# Patient Record
Sex: Female | Born: 1980 | Race: Black or African American | Hispanic: No | Marital: Married | Smoking: Never smoker
Health system: Southern US, Community
[De-identification: ages and names within clinical notes are randomized; demographics above are authoritative.]

## PROBLEM LIST (undated history)

## (undated) DIAGNOSIS — I10 Essential (primary) hypertension: Secondary | ICD-10-CM

## (undated) DIAGNOSIS — D62 Acute posthemorrhagic anemia: Secondary | ICD-10-CM

---

## 2005-12-28 ENCOUNTER — Emergency Department (HOSPITAL_COMMUNITY): Admission: EM | Admit: 2005-12-28 | Discharge: 2005-12-28 | Payer: Self-pay | Admitting: Family Medicine

## 2006-04-18 ENCOUNTER — Emergency Department (HOSPITAL_COMMUNITY): Admission: EM | Admit: 2006-04-18 | Discharge: 2006-04-18 | Payer: Self-pay | Admitting: Family Medicine

## 2006-05-26 ENCOUNTER — Emergency Department (HOSPITAL_COMMUNITY): Admission: AD | Admit: 2006-05-26 | Discharge: 2006-05-26 | Payer: Self-pay | Admitting: Family Medicine

## 2007-01-07 ENCOUNTER — Emergency Department (HOSPITAL_COMMUNITY): Admission: EM | Admit: 2007-01-07 | Discharge: 2007-01-07 | Payer: Self-pay | Admitting: Emergency Medicine

## 2008-10-13 ENCOUNTER — Emergency Department (HOSPITAL_COMMUNITY): Admission: EM | Admit: 2008-10-13 | Discharge: 2008-10-13 | Payer: Self-pay | Admitting: Family Medicine

## 2009-09-26 ENCOUNTER — Emergency Department (HOSPITAL_COMMUNITY): Admission: EM | Admit: 2009-09-26 | Discharge: 2009-09-26 | Payer: Self-pay | Admitting: Emergency Medicine

## 2011-08-17 LAB — POCT RAPID STREP A: Streptococcus, Group A Screen (Direct): NEGATIVE

## 2012-09-30 ENCOUNTER — Encounter (HOSPITAL_COMMUNITY): Payer: Self-pay | Admitting: Emergency Medicine

## 2012-09-30 ENCOUNTER — Emergency Department (HOSPITAL_COMMUNITY)
Admission: EM | Admit: 2012-09-30 | Discharge: 2012-09-30 | Disposition: A | Discharge status: Home / Self Care | Payer: Self-pay | Source: Home / Self Care | Attending: Emergency Medicine | Admitting: Emergency Medicine

## 2012-09-30 DIAGNOSIS — H109 Unspecified conjunctivitis: Secondary | ICD-10-CM

## 2012-09-30 DIAGNOSIS — J020 Streptococcal pharyngitis: Secondary | ICD-10-CM

## 2012-09-30 MED ORDER — ACETAMINOPHEN 325 MG PO TABS
ORAL_TABLET | ORAL | Status: AC
  Filled 2012-09-30: qty 2

## 2012-09-30 MED ORDER — PENICILLIN V POTASSIUM 500 MG PO TABS: 500.0000 mg | ORAL_TABLET | Freq: Four times a day (QID) | ORAL | Status: DC

## 2012-09-30 MED ORDER — ACETAMINOPHEN 325 MG PO TABS
650.0000 mg | ORAL_TABLET | Freq: Once | ORAL | Status: AC
  Administered 2012-09-30: 650 mg via ORAL

## 2012-09-30 MED ORDER — POLYMYXIN B-TRIMETHOPRIM 10000-0.1 UNIT/ML-% OP SOLN: 1.0000 [drp] | OPHTHALMIC | Status: DC

## 2012-09-30 NOTE — ED Notes (Signed)
Provided ginger ale/ice.   Provided options to family

## 2012-09-30 NOTE — ED Notes (Signed)
Clarification , swabbed throat prior to administering tylenol and offering ginger ale/ic/water.

## 2012-09-30 NOTE — ED Provider Notes (Signed)
Chief Complaint  Patient presents with  . URI    History of Present Illness:   Morgan Colon is a 31 year old female who presents with a six-day history that began with bilateral red and sore eyes. She had clear discharge and some crusting. Thereafter she noted nasal congestion with bloody, yellow drainage, sneezing, and left ear congestion. Her throat was very sore and she had pain on swallowing. Her neck felt sore, she has fever, chills, and nausea. She denies any cough.  Review of Systems:  Other than noted above, the patient denies any of the following symptoms. Systemic:  No fever, chills, sweats, fatigue, myalgias, headache, or anorexia. Eye:  No redness, pain or drainage. ENT:  No earache, ear congestion, nasal congestion, sneezing, rhinorrhea, sinus pressure, sinus pain, post nasal drip, or sore throat. Lungs:  No cough, sputum production, wheezing, shortness of breath, or chest pain. GI:  No abdominal pain, nausea, vomiting, or diarrhea.  PMFSH:  Past medical history, family history, social history, meds, and allergies were reviewed.  Physical Exam:   Vital signs:  BP 122/86  Pulse 120  Temp 100.3 F (37.9 C) (Oral)  Resp 22  SpO2 98%  LMP 09/11/2012 General:  Alert, in no distress. Eye:  Conjunctiva is are injected and there is no drainage or crusting, corneas are intact, anterior chambers are normal, PERRLA, full EOMs. Lids were normal. ENT:  There was fluid behind the left TM but no erythema or inflammation. The right TM was normal.  Nasal mucosa was clear and uncongested, without drainage.  Mucous membranes were moist.  Tonsils were enlarged and red with copious white exudate.  There were no oral ulcerations or lesions. Neck:  Supple, no adenopathy, tenderness or mass. Lungs:  No respiratory distress.  Lungs were clear to auscultation, without wheezes, rales or rhonchi.  Breath sounds were clear and equal bilaterally.  Heart:  Regular rhythm, without gallops, murmers or  rubs. Skin:  Clear, warm, and dry, without rash or lesions.  Labs:   Results for orders placed during the hospital encounter of 09/30/12  POCT RAPID STREP A ([facility] ONLY)      Component Value Range   Streptococcus, Group A Screen (Direct) POSITIVE (*) NEGATIVE   Assessment:  The primary encounter diagnosis was Strep throat. A diagnosis of Conjunctivitis was also pertinent to this visit.  Plan:   1.  The following meds were prescribed:   New Prescriptions   PENICILLIN V POTASSIUM (VEETID) 500 MG TABLET    Take 1 tablet (500 mg total) by mouth 4 (four) times daily.   TRIMETHOPRIM-POLYMYXIN B (POLYTRIM) OPHTHALMIC SOLUTION    Place 1 drop into both eyes every 4 (four) hours.   2.  The patient was instructed in symptomatic care and handouts were given. 3.  The patient was told to return if becoming worse in any way, if no better in 3 or 4 days, and given some red flag symptoms that would indicate earlier return.   Reuben Likes, MD 09/30/12 [phone]

## 2012-09-30 NOTE — ED Notes (Signed)
Notified dr Lorenz Coaster of patient complaint, elevated heart rate, elevated temp.

## 2012-09-30 NOTE — ED Notes (Signed)
Onset of symptoms reported as Monday with left eye being red, since then has progressed with nasal stuffiness, sore throat, left ear fullness.  Did not check temp, but reports chills.  This am reports nausea, no vomiting, no diarrhea

## 2012-10-01 ENCOUNTER — Telehealth (HOSPITAL_COMMUNITY): Payer: Self-pay | Admitting: Emergency Medicine

## 2012-10-01 NOTE — ED Notes (Signed)
Patient called requesting pain medication.  She also stated she felt worse today then she did yesterday.  Patient stated she has not taken any ibuprofen or tylenol today.  I explained to patient the importance of fever control.  Patient expressed understanding.  Dr Lorenz Coaster reviewed chart and RX for tramadol 50mg  qty 30 2 every 8 hours as needed was called into the [facility] on [address].  Patient made aware and did not have any additional comments or concerns

## 2014-01-15 ENCOUNTER — Emergency Department (HOSPITAL_COMMUNITY)
Admission: EM | Admit: 2014-01-15 | Discharge: 2014-01-15 | Disposition: A | Discharge status: Home / Self Care | Payer: BC Managed Care – PPO | Source: Home / Self Care | Attending: Family Medicine | Admitting: Family Medicine

## 2014-01-15 ENCOUNTER — Encounter (HOSPITAL_COMMUNITY): Payer: Self-pay | Admitting: Emergency Medicine

## 2014-01-15 DIAGNOSIS — J069 Acute upper respiratory infection, unspecified: Secondary | ICD-10-CM

## 2014-01-15 MED ORDER — GUAIFENESIN-CODEINE 100-10 MG/5ML PO SYRP: 10.0000 mL | ORAL_SOLUTION | Freq: Four times a day (QID) | ORAL | Status: DC | PRN

## 2014-01-15 MED ORDER — IPRATROPIUM BROMIDE 0.06 % NA SOLN: 2.0000 | Freq: Four times a day (QID) | NASAL | Status: DC

## 2014-01-15 NOTE — ED Notes (Signed)
Patient complains of head and chest congestion; sinus drainage and sore throat and cough; denies fever/chills, n/v, diarrhea.

## 2014-01-15 NOTE — Discharge Instructions (Signed)
Drink plenty of fluids as discussed, use medicine as prescribed, and mucinex or delsym for cough. Return or see your doctor if further problems and have blood pressure evaluation.

## 2014-01-15 NOTE — ED Provider Notes (Signed)
CSN: [id number]     Arrival date & time 01/15/14  1200 History   First MD Initiated Contact with Patient 01/15/14 1308     Chief Complaint  Patient presents with  . URI   (Consider location/radiation/quality/duration/timing/severity/associated sxs/prior Treatment) Patient is a 33 y.o. female presenting with URI. The history is provided by the patient.  URI Presenting symptoms: congestion, cough, rhinorrhea and sore throat   Presenting symptoms: no fever   Severity:  Mild Onset quality:  Gradual Duration:  2 days Progression:  Unchanged Chronicity:  New Ineffective treatments:  OTC medications Risk factors: sick contacts     History reviewed. No pertinent past medical history. History reviewed. No pertinent past surgical history. No family history on file. History  Substance Use Topics  . Smoking status: Never Smoker   . Smokeless tobacco: Not on file  . Alcohol Use: No   OB History   Grav Para Term Preterm Abortions TAB SAB Ect Mult Living                 Review of Systems  Constitutional: Negative.  Negative for fever.  HENT: Positive for congestion, postnasal drip, rhinorrhea and sore throat.   Respiratory: Positive for cough.   Cardiovascular: Negative.   Gastrointestinal: Negative.     Allergies  Review of patient's allergies indicates no known allergies.  Home Medications   Current Outpatient Rx  Name  Route  Sig  Dispense  Refill  . Phenyleph-Doxylamine-DM-APAP (ALKA-SELTZER PLS NIGHT CLD/FLU PO)   Oral   Take by mouth.         Marland Kitchen. guaiFENesin-codeine (ROBITUSSIN AC) 100-10 MG/5ML syrup   Oral   Take 10 mLs by mouth 4 (four) times daily as needed for cough.   180 mL   0   . ipratropium (ATROVENT) 0.06 % nasal spray   Each Nare   Place 2 sprays into both nostrils 4 (four) times daily.   15 mL   1   . penicillin v potassium (VEETID) 500 MG tablet   Oral   Take 1 tablet (500 mg total) by mouth 4 (four) times daily.   40 tablet   0   .  trimethoprim-polymyxin b (POLYTRIM) ophthalmic solution   Both Eyes   Place 1 drop into both eyes every 4 (four) hours.   10 mL   0    BP 156/111  Pulse 101  Temp(Src) 98 F (36.7 C) (Oral)  Resp 16  SpO2 96%  LMP 01/15/2014 Physical Exam  Nursing note and vitals reviewed. Constitutional: She is oriented to person, place, and time. She appears well-developed and well-nourished. No distress.  HENT:  Right Ear: External ear normal.  Left Ear: External ear normal.  Nose: Mucosal edema and rhinorrhea present.  Mouth/Throat: Oropharynx is clear and moist. No oropharyngeal exudate.  Eyes: Pupils are equal, round, and reactive to light.  Neck: Normal range of motion. Neck supple.  Cardiovascular: Regular rhythm and normal heart sounds.   Pulmonary/Chest: Breath sounds normal.  Lymphadenopathy:    She has no cervical adenopathy.  Neurological: She is alert and oriented to person, place, and time.  Skin: Skin is warm and dry.    ED Course  Procedures (including critical care time) Labs Review Labs Reviewed - No data to display Imaging Review No results found.   MDM   1. URI (upper respiratory infection)        Linna HoffJames D Jalee Saine, MD 01/15/14 1340

## 2018-01-10 ENCOUNTER — Encounter (HOSPITAL_COMMUNITY): Payer: Self-pay | Admitting: *Deleted

## 2018-01-10 ENCOUNTER — Other Ambulatory Visit: Payer: Self-pay | Admitting: Obstetrics and Gynecology

## 2018-01-10 ENCOUNTER — Other Ambulatory Visit: Payer: Self-pay

## 2018-01-10 ENCOUNTER — Inpatient Hospital Stay (HOSPITAL_COMMUNITY)
Admission: AD | Admit: 2018-01-10 | Discharge: 2018-01-10 | Disposition: A | Discharge status: Home / Self Care | Payer: BC Managed Care – PPO | Source: Ambulatory Visit | Attending: Obstetrics and Gynecology | Admitting: Obstetrics and Gynecology

## 2018-01-10 ENCOUNTER — Inpatient Hospital Stay (HOSPITAL_COMMUNITY): Payer: BC Managed Care – PPO

## 2018-01-10 DIAGNOSIS — E669 Obesity, unspecified: Secondary | ICD-10-CM | POA: Insufficient documentation

## 2018-01-10 DIAGNOSIS — O99213 Obesity complicating pregnancy, third trimester: Secondary | ICD-10-CM | POA: Diagnosis not present

## 2018-01-10 DIAGNOSIS — O321XX0 Maternal care for breech presentation, not applicable or unspecified: Secondary | ICD-10-CM | POA: Diagnosis not present

## 2018-01-10 DIAGNOSIS — I10 Essential (primary) hypertension: Secondary | ICD-10-CM

## 2018-01-10 DIAGNOSIS — O133 Gestational [pregnancy-induced] hypertension without significant proteinuria, third trimester: Secondary | ICD-10-CM | POA: Diagnosis present

## 2018-01-10 DIAGNOSIS — Z3A34 34 weeks gestation of pregnancy: Secondary | ICD-10-CM | POA: Insufficient documentation

## 2018-01-10 DIAGNOSIS — O10013 Pre-existing essential hypertension complicating pregnancy, third trimester: Secondary | ICD-10-CM

## 2018-01-10 LAB — COMPREHENSIVE METABOLIC PANEL
ALBUMIN: 2.4 g/dL — AB (ref 3.5–5.0)
ALK PHOS: 151 U/L — AB (ref 38–126)
ALT: 15 U/L (ref 14–54)
ANION GAP: 7 (ref 5–15)
AST: 19 U/L (ref 15–41)
BILIRUBIN TOTAL: 0.7 mg/dL (ref 0.3–1.2)
BUN: 6 mg/dL (ref 6–20)
CO2: 22 mmol/L (ref 22–32)
Calcium: 8.7 mg/dL — ABNORMAL LOW (ref 8.9–10.3)
Chloride: 103 mmol/L (ref 101–111)
Creatinine, Ser: 0.49 mg/dL (ref 0.44–1.00)
GFR calc Af Amer: >60 mL/min (ref >60)
GFR calc non Af Amer: >60 mL/min (ref >60)
GLUCOSE: 91 mg/dL (ref 65–99)
Potassium: 3.9 mmol/L (ref 3.5–5.1)
SODIUM: 132 mmol/L — AB (ref 135–145)
TOTAL PROTEIN: 6 g/dL — AB (ref 6.5–8.1)

## 2018-01-10 LAB — PROTEIN / CREATININE RATIO, URINE
Creatinine, Urine: 16 mg/dL
Total Protein, Urine: <6 mg/dL

## 2018-01-10 LAB — CBC
HCT: 28.1 % — ABNORMAL LOW (ref 36.0–46.0)
HEMOGLOBIN: 9 g/dL — AB (ref 12.0–15.0)
MCH: 25.4 pg — ABNORMAL LOW (ref 26.0–34.0)
MCHC: 32 g/dL (ref 30.0–36.0)
MCV: 79.4 fL (ref 78.0–100.0)
Platelets: 282 10*3/uL (ref 150–400)
RBC: 3.54 MIL/uL — AB (ref 3.87–5.11)
RDW: 14.1 % (ref 11.5–15.5)
WBC: 9.9 10*3/uL (ref 4.0–10.5)

## 2018-01-10 LAB — URIC ACID: URIC ACID, SERUM: 3.5 mg/dL (ref 2.3–6.6)

## 2018-01-10 MED ORDER — BETAMETHASONE SOD PHOS & ACET 6 (3-3) MG/ML IJ SUSP
12.0000 mg | Freq: Once | INTRAMUSCULAR | Status: AC
  Administered 2018-01-10: 12 mg via INTRAMUSCULAR
  Filled 2018-01-10: qty 2

## 2018-01-10 MED ORDER — LABETALOL HCL 100 MG PO TABS: 100.0000 mg | ORAL_TABLET | Freq: Two times a day (BID) | ORAL | 4 refills | Status: DC

## 2018-01-10 MED ORDER — LABETALOL HCL 100 MG PO TABS
100.0000 mg | ORAL_TABLET | Freq: Two times a day (BID) | ORAL | Status: DC
  Administered 2018-01-10: 100 mg via ORAL
  Filled 2018-01-10: qty 1

## 2018-01-10 NOTE — MAU Note (Signed)
BP fluctuates, has been pretty good, last 2 visits has been going up. Denies HA, visual changes, epigastric pain or increased swelling.  Dent over for further eval

## 2018-01-10 NOTE — MAU Provider Note (Signed)
History     Chief Complaint  Patient presents with  . Hypertension  37 yo G2P1001 BF @ [redacted] weeks gestation with chronic HTN . Pt was sent from office due to elevated BP( 150/100, 146/101 r/o  Superimposed preeclampsia Pt has been on bed rest since 24 hr UTP 591 mg. Denies visual changes or epigastric pain or h/a. No leg swelling  OB History    Gravida Para Term Preterm AB Living   2 1 1     1    SAB TAB Ectopic Multiple Live Births                  History reviewed. No pertinent past medical history.  History reviewed. No pertinent surgical history.  Family History  Problem Relation Age of Onset  . Hypertension Mother   . Diabetes Mother     Social History   Tobacco Use  . Smoking status: Never Smoker  . Smokeless tobacco: Never Used  Substance Use Topics  . Alcohol use: No  . Drug use: No    Allergies: No Known Allergies  Medications Prior to Admission  Medication Sig Dispense Refill Last Dose  . guaiFENesin-codeine (ROBITUSSIN AC) 100-10 MG/5ML syrup Take 10 mLs by mouth 4 (four) times daily as needed for cough. 180 mL 0   . ipratropium (ATROVENT) 0.06 % nasal spray Place 2 sprays into both nostrils 4 (four) times daily. 15 mL 1   . penicillin v potassium (VEETID) 500 MG tablet Take 1 tablet (500 mg total) by mouth 4 (four) times daily. 40 tablet 0   . Phenyleph-Doxylamine-DM-APAP (ALKA-SELTZER PLS NIGHT CLD/FLU PO) Take by mouth.   01/15/2014 at Unknown time  . trimethoprim-polymyxin b (POLYTRIM) ophthalmic solution Place 1 drop into both eyes every 4 (four) hours. 10 mL 0      Physical Exam   Blood pressure (!) 143/98, pulse (!) 101, temperature 98.9 F (37.2 C), temperature source Oral, resp. rate 18, weight 117.8 kg (259 lb 12 oz), SpO2 98 %.  General appearance: alert, cooperative and no distress Heart: regular rate and rhythm, S1, S2 normal, no murmur, click, rub or gallop Abdomen: gravid Pelvic: external genitalia normal and cervix  long/closed/OOP   Tracing: baseline 150 NR small accel   ED Course  ZOX:[id number]:chronic HTN r/o preeclampsia P) PIH labs. BPP MDM Addendum:  Margo Ayesono done: breech, 7lb 1 oz( 90%) nl fluid. BPP 8/8 Labs: protein/creatinine ratio pending AST/ALT 19/19 plt 282K hgb 9 Uric acid 3.5  Imp: chronic HTN w/o superimposed preeclampsia. Breech. BMZ #1 today Labetalol 100 mg po bid  D/c home. PIH warning signs f/u office for 2nd BMZ   Serita KyleSheronette A Ladell Bey, MD 1:15 PM 01/10/2018

## 2018-01-10 NOTE — Discharge Instructions (Signed)
Preeclampsia and Eclampsia °Preeclampsia is a serious condition that develops only during pregnancy. It is also called toxemia of pregnancy. This condition causes high blood pressure along with other symptoms, such as swelling and headaches. These symptoms may develop as the condition gets worse. Preeclampsia may occur at 20 weeks of pregnancy or later. °Diagnosing and treating preeclampsia early is very important. If not treated early, it can cause serious problems for you and your baby. One problem it can lead to is eclampsia, which is a condition that causes muscle jerking or shaking (convulsions or seizures) in the mother. Delivering your baby is the best treatment for preeclampsia or eclampsia. Preeclampsia and eclampsia symptoms usually go away after your baby is born. °What are the causes? °The cause of preeclampsia is not known. °What increases the risk? °The following risk factors make you more likely to develop preeclampsia: °· Being pregnant for the first time. °· Having had preeclampsia during a past pregnancy. °· Having a family history of preeclampsia. °· Having high blood pressure. °· Being pregnant with twins or triplets. °· Being 35 or older. °· Being African-American. °· Having kidney disease or diabetes. °· Having medical conditions such as lupus or blood diseases. °· Being very overweight (obese). ° °What are the signs or symptoms? °The earliest signs of preeclampsia are: °· High blood pressure. °· Increased protein in your urine. Your health care provider will check for this at every visit before you give birth (prenatal visit). ° °Other symptoms that may develop as the condition gets worse include: °· Severe headaches. °· Sudden weight gain. °· Swelling of the hands, face, legs, and feet. °· Nausea and vomiting. °· Vision problems, such as blurred or double vision. °· Numbness in the face, arms, legs, and feet. °· Urinating less than usual. °· Dizziness. °· Slurred speech. °· Abdominal pain,  especially upper abdominal pain. °· Convulsions or seizures. ° °Symptoms generally go away after giving birth. °How is this diagnosed? °There are no screening tests for preeclampsia. Your health care provider will ask you about symptoms and check for signs of preeclampsia during your prenatal visits. You may also have tests that include: °· Urine tests. °· Blood tests. °· Checking your blood pressure. °· Monitoring your baby’s heart rate. °· Ultrasound. ° °How is this treated? °You and your health care provider will determine the treatment approach that is best for you. Treatment may include: °· Having more frequent prenatal exams to check for signs of preeclampsia, if you have an increased risk for preeclampsia. °· Bed rest. °· Reducing how much salt (sodium) you eat. °· Medicine to lower your blood pressure. °· Staying in the hospital, if your condition is severe. There, treatment will focus on controlling your blood pressure and the amount of fluids in your body (fluid retention). °· You may need to take medicine (magnesium sulfate) to prevent seizures. This medicine may be given as an injection or through an IV tube. °· Delivering your baby early, if your condition gets worse. You may have your labor started with medicine (induced), or you may have a cesarean delivery. ° °Follow these instructions at home: °Eating and drinking ° °· Drink enough fluid to keep your urine clear or pale yellow. °· Eat a healthy diet that is low in sodium. Do not add salt to your food. Check nutrition labels to see how much sodium a food or beverage contains. °· Avoid caffeine. °Lifestyle °· Do not use any products that contain nicotine or tobacco, such as cigarettes   and e-cigarettes. If you need help quitting, ask your health care provider. °· Do not use alcohol or drugs. °· Avoid stress as much as possible. Rest and get plenty of sleep. °General instructions °· Take over-the-counter and prescription medicines only as told by your  health care provider. °· When lying down, lie on your side. This keeps pressure off of your baby. °· When sitting or lying down, raise (elevate) your feet. Try putting some pillows underneath your lower legs. °· Exercise regularly. Ask your health care provider what kinds of exercise are best for you. °· Keep all follow-up and prenatal visits as told by your health care provider. This is important. °How is this prevented? °To prevent preeclampsia or eclampsia from developing during another pregnancy: °· Get proper medical care during pregnancy. Your health care provider may be able to prevent preeclampsia or diagnose and treat it early. °· Your health care provider may have you take a low-dose aspirin or a calcium supplement during your next pregnancy. °· You may have tests of your blood pressure and kidney function after giving birth. °· Maintain a healthy weight. Ask your health care provider for help managing weight gain during pregnancy. °· Work with your health care provider to manage any long-term (chronic) health conditions you have, such as diabetes or kidney problems. ° °Contact a health care provider if: °· You gain more weight than expected. °· You have headaches. °· You have nausea or vomiting. °· You have abdominal pain. °· You feel dizzy or light-headed. °Get help right away if: °· You develop sudden or severe swelling anywhere in your body. This usually happens in the legs. °· You gain 5 lbs (2.3 kg) or more during one week. °· You have severe: °? Abdominal pain. °? Headaches. °? Dizziness. °? Vision problems. °? Confusion. °? Nausea or vomiting. °· You have a seizure. °· You have trouble moving any part of your body. °· You develop numbness in any part of your body. °· You have trouble speaking. °· You have any abnormal bleeding. °· You pass out. °This information is not intended to replace advice given to you by your health care provider. Make sure you discuss any questions you have with your health  care provider. °Document Released: 10/29/2000 Document Revised: 06/29/2016 Document Reviewed: 06/07/2016 °Elsevier Interactive Patient Education © 2018 [employer] ° °

## 2018-01-16 ENCOUNTER — Encounter (HOSPITAL_COMMUNITY): Payer: Self-pay | Admitting: *Deleted

## 2018-01-16 ENCOUNTER — Other Ambulatory Visit: Payer: Self-pay

## 2018-01-16 ENCOUNTER — Inpatient Hospital Stay (HOSPITAL_COMMUNITY)
Admission: AD | Admit: 2018-01-16 | Discharge: 2018-01-16 | Disposition: A | Discharge status: Home / Self Care | Payer: BC Managed Care – PPO | Source: Ambulatory Visit | Attending: Obstetrics and Gynecology | Admitting: Obstetrics and Gynecology

## 2018-01-16 DIAGNOSIS — Z79899 Other long term (current) drug therapy: Secondary | ICD-10-CM | POA: Diagnosis not present

## 2018-01-16 DIAGNOSIS — Z8249 Family history of ischemic heart disease and other diseases of the circulatory system: Secondary | ICD-10-CM | POA: Insufficient documentation

## 2018-01-16 DIAGNOSIS — Z833 Family history of diabetes mellitus: Secondary | ICD-10-CM | POA: Diagnosis not present

## 2018-01-16 DIAGNOSIS — O10013 Pre-existing essential hypertension complicating pregnancy, third trimester: Secondary | ICD-10-CM

## 2018-01-16 DIAGNOSIS — Z3A34 34 weeks gestation of pregnancy: Secondary | ICD-10-CM | POA: Insufficient documentation

## 2018-01-16 DIAGNOSIS — O163 Unspecified maternal hypertension, third trimester: Secondary | ICD-10-CM | POA: Diagnosis not present

## 2018-01-16 DIAGNOSIS — R51 Headache: Secondary | ICD-10-CM | POA: Diagnosis present

## 2018-01-16 LAB — CBC
HEMATOCRIT: 28.5 % — AB (ref 36.0–46.0)
HEMOGLOBIN: 9.2 g/dL — AB (ref 12.0–15.0)
MCH: 25.3 pg — ABNORMAL LOW (ref 26.0–34.0)
MCHC: 32.3 g/dL (ref 30.0–36.0)
MCV: 78.3 fL (ref 78.0–100.0)
Platelets: 329 10*3/uL (ref 150–400)
RBC: 3.64 MIL/uL — AB (ref 3.87–5.11)
RDW: 14.3 % (ref 11.5–15.5)
WBC: 11.9 10*3/uL — ABNORMAL HIGH (ref 4.0–10.5)

## 2018-01-16 LAB — PROTEIN / CREATININE RATIO, URINE
Creatinine, Urine: 78 mg/dL
Protein Creatinine Ratio: 0.21 mg/mg{Cre} — ABNORMAL HIGH (ref 0.00–0.15)
Total Protein, Urine: 16 mg/dL

## 2018-01-16 LAB — COMPREHENSIVE METABOLIC PANEL
ALBUMIN: 2.5 g/dL — AB (ref 3.5–5.0)
ALK PHOS: 144 U/L — AB (ref 38–126)
ALT: 15 U/L (ref 14–54)
ANION GAP: 7 (ref 5–15)
AST: 18 U/L (ref 15–41)
BILIRUBIN TOTAL: 0.5 mg/dL (ref 0.3–1.2)
BUN: 8 mg/dL (ref 6–20)
CALCIUM: 8.6 mg/dL — AB (ref 8.9–10.3)
CO2: 24 mmol/L (ref 22–32)
Chloride: 102 mmol/L (ref 101–111)
Creatinine, Ser: 0.66 mg/dL (ref 0.44–1.00)
GFR calc Af Amer: >60 mL/min (ref >60)
GFR calc non Af Amer: >60 mL/min (ref >60)
GLUCOSE: 94 mg/dL (ref 65–99)
Potassium: 4.3 mmol/L (ref 3.5–5.1)
SODIUM: 133 mmol/L — AB (ref 135–145)
TOTAL PROTEIN: 6.4 g/dL — AB (ref 6.5–8.1)

## 2018-01-16 LAB — URIC ACID: Uric Acid, Serum: 3.7 mg/dL (ref 2.3–6.6)

## 2018-01-16 MED ORDER — LABETALOL HCL 200 MG PO TABS: 200.0000 mg | ORAL_TABLET | Freq: Two times a day (BID) | ORAL | 4 refills | Status: DC

## 2018-01-16 MED ORDER — ACETAMINOPHEN 500 MG PO TABS
1000.0000 mg | ORAL_TABLET | Freq: Once | ORAL | Status: AC
  Administered 2018-01-16: 1000 mg via ORAL
  Filled 2018-01-16: qty 2

## 2018-01-16 NOTE — MAU Note (Signed)
BP elevated.  Not really feeling good, head hurts.  Denies visual changes, epigastric pain or increase in swelling.

## 2018-01-16 NOTE — MAU Note (Signed)
Sent from Dr. Cherly Hensenousins office with a headache and elevated BPs.

## 2018-01-16 NOTE — MAU Note (Signed)
Urine is in the Lab 

## 2018-01-16 NOTE — MAU Provider Note (Signed)
History     Chief Complaint  Patient presents with  . Headache  . Hypertension    37 yo G2P1001 BF @ 34 6/[redacted] weeks gestation with chronic HTN sent for further evaluation due to slight h/a. BP at time was 140/90  Denies visual changes or epigastric pain. sono done at office had BPP 8/8 , nl fluid  OB History    Gravida Para Term Preterm AB Living   2 1 1     1    SAB TAB Ectopic Multiple Live Births                  History reviewed. No pertinent past medical history.  History reviewed. No pertinent surgical history.  Family History  Problem Relation Age of Onset  . Hypertension Mother   . Diabetes Mother     Social History   Tobacco Use  . Smoking status: Never Smoker  . Smokeless tobacco: Never Used  Substance Use Topics  . Alcohol use: No  . Drug use: No    Allergies: No Known Allergies  Medications Prior to Admission  Medication Sig Dispense Refill Last Dose  . labetalol (NORMODYNE) 100 MG tablet Take 1 tablet (100 mg total) by mouth 2 (two) times daily. 60 tablet 4   . Prenatal Vit-Fe Fumarate-FA (PRENATAL MULTIVITAMIN) TABS tablet Take 1 tablet by mouth daily at 12 noon.   01/10/2018 at Unknown time     Physical Exam   Blood pressure 134/72, pulse 94, temperature 99 F (37.2 C), temperature source Oral, resp. rate 19, height 5\' 4"  (1.626 m), weight 117.1 kg (258 lb 4 oz), SpO2 97 %.  No exam performed today, done in office.  Tracing baseline 140 (+) accel to 155 No ctx  IMP: chronic HTN on med  headache in pregnancy IUP @ 34 6/7 week P) PIH labs.  ED Course   MDM Addendum: AST/ALT  18/15.  Protein/creatinine ratio 0.21. plt 329K . hgb 9.2. Uric acid 3.7 No evidence of superimposed preeclampsia. D/c home cont with labetalol. PIH warning signs. F/u office 3/8 Serita KyleSheronette A Marylan Glore, MD 9:05 PM 01/16/2018

## 2018-01-26 ENCOUNTER — Inpatient Hospital Stay (HOSPITAL_COMMUNITY)
Admission: AD | Admit: 2018-01-26 | Discharge: 2018-01-26 | Disposition: A | Discharge status: Home / Self Care | Payer: BC Managed Care – PPO | Source: Ambulatory Visit | Attending: Obstetrics and Gynecology | Admitting: Obstetrics and Gynecology

## 2018-01-26 ENCOUNTER — Encounter (HOSPITAL_COMMUNITY): Payer: Self-pay | Admitting: *Deleted

## 2018-01-26 ENCOUNTER — Inpatient Hospital Stay (HOSPITAL_COMMUNITY): Payer: BC Managed Care – PPO

## 2018-01-26 ENCOUNTER — Other Ambulatory Visit: Payer: Self-pay

## 2018-01-26 ENCOUNTER — Other Ambulatory Visit: Payer: Self-pay | Admitting: Obstetrics and Gynecology

## 2018-01-26 DIAGNOSIS — O10919 Unspecified pre-existing hypertension complicating pregnancy, unspecified trimester: Secondary | ICD-10-CM

## 2018-01-26 DIAGNOSIS — O288 Other abnormal findings on antenatal screening of mother: Secondary | ICD-10-CM

## 2018-01-26 DIAGNOSIS — Z3A37 37 weeks gestation of pregnancy: Secondary | ICD-10-CM | POA: Insufficient documentation

## 2018-01-26 DIAGNOSIS — O163 Unspecified maternal hypertension, third trimester: Secondary | ICD-10-CM | POA: Insufficient documentation

## 2018-01-26 DIAGNOSIS — O99213 Obesity complicating pregnancy, third trimester: Secondary | ICD-10-CM

## 2018-01-26 DIAGNOSIS — O403XX0 Polyhydramnios, third trimester, not applicable or unspecified: Secondary | ICD-10-CM | POA: Diagnosis not present

## 2018-01-26 DIAGNOSIS — Z3A36 36 weeks gestation of pregnancy: Secondary | ICD-10-CM

## 2018-01-26 NOTE — MAU Provider Note (Signed)
History     Chief Complaint  Patient presents with  . Non-stress Test   37 yo G2P1001 MBF @ 36 2/[redacted] weeks gestation with chronic HTn on labetalol sent for BPP due to NR NST in office  OB History    Gravida Para Term Preterm AB Living   2 1 1     1    SAB TAB Ectopic Multiple Live Births                  History reviewed. No pertinent past medical history.  History reviewed. No pertinent surgical history.  Family History  Problem Relation Age of Onset  . Hypertension Mother   . Diabetes Mother     Social History   Tobacco Use  . Smoking status: Never Smoker  . Smokeless tobacco: Never Used  Substance Use Topics  . Alcohol use: No  . Drug use: No    Allergies: No Known Allergies  Medications Prior to Admission  Medication Sig Dispense Refill Last Dose  . labetalol (NORMODYNE) 200 MG tablet Take 1 tablet (200 mg total) by mouth 2 (two) times daily. 60 tablet 4   . Prenatal Vit-Fe Fumarate-FA (PRENATAL MULTIVITAMIN) TABS tablet Take 1 tablet by mouth daily at 12 noon.   01/10/2018 at Unknown time     Physical Exam   Blood pressure 139/80, pulse 94, temperature 98.4 F (36.9 C), temperature source Oral, resp. rate 18, height 5\' 4"  (1.626 m), weight 115.7 kg (255 lb).  No exam performed today, done in offfice.  Tracing: baseline 150 (+) accel to 165  IMP: chronic HTn w/o superimposed preeclampsia on labetalol here for further antepartum testing IUP@ 36 2/7 weeks P) BPP ED Course   MDM Addendum Sono: BPP 8/8,  Mild polyhydramnios Breech P) d/c home.  Pt desires ECV. Cont antepartum surveillance. PIH warning signs. Cont labetalol Serita KyleSheronette A Noal Abshier, MD 1:01 PM 01/26/2018

## 2018-01-26 NOTE — MAU Note (Signed)
Pt sent from office for non-reactive NST. Needs BPP and more monitoring. Pt reports good fetal movement.

## 2018-02-04 ENCOUNTER — Encounter (HOSPITAL_COMMUNITY): Payer: Self-pay

## 2018-02-04 ENCOUNTER — Inpatient Hospital Stay (HOSPITAL_COMMUNITY): Payer: BC Managed Care – PPO | Admitting: Anesthesiology

## 2018-02-04 ENCOUNTER — Inpatient Hospital Stay (HOSPITAL_COMMUNITY)
Admission: AD | Admit: 2018-02-04 | Discharge: 2018-02-07 | DRG: 787 | Disposition: A | Discharge status: Home / Self Care | Payer: BC Managed Care – PPO | Source: Ambulatory Visit | Attending: Obstetrics and Gynecology | Admitting: Obstetrics and Gynecology

## 2018-02-04 ENCOUNTER — Encounter (HOSPITAL_COMMUNITY)
Admission: AD | Disposition: A | Discharge status: Home / Self Care | Payer: Self-pay | Source: Ambulatory Visit | Attending: Obstetrics and Gynecology

## 2018-02-04 DIAGNOSIS — O9081 Anemia of the puerperium: Secondary | ICD-10-CM | POA: Diagnosis not present

## 2018-02-04 DIAGNOSIS — O1002 Pre-existing essential hypertension complicating childbirth: Secondary | ICD-10-CM | POA: Diagnosis present

## 2018-02-04 DIAGNOSIS — D62 Acute posthemorrhagic anemia: Secondary | ICD-10-CM | POA: Diagnosis not present

## 2018-02-04 DIAGNOSIS — O99214 Obesity complicating childbirth: Secondary | ICD-10-CM | POA: Diagnosis present

## 2018-02-04 DIAGNOSIS — O139 Gestational [pregnancy-induced] hypertension without significant proteinuria, unspecified trimester: Secondary | ICD-10-CM

## 2018-02-04 DIAGNOSIS — Z3A37 37 weeks gestation of pregnancy: Secondary | ICD-10-CM

## 2018-02-04 DIAGNOSIS — O321XX0 Maternal care for breech presentation, not applicable or unspecified: Secondary | ICD-10-CM | POA: Diagnosis not present

## 2018-02-04 HISTORY — DX: Acute posthemorrhagic anemia: D62

## 2018-02-04 LAB — CBC
HCT: 28.7 % — ABNORMAL LOW (ref 36.0–46.0)
HEMATOCRIT: 28.5 % — AB (ref 36.0–46.0)
HEMOGLOBIN: 9 g/dL — AB (ref 12.0–15.0)
Hemoglobin: 9 g/dL — ABNORMAL LOW (ref 12.0–15.0)
MCH: 24.3 pg — AB (ref 26.0–34.0)
MCH: 24.2 pg — ABNORMAL LOW (ref 26.0–34.0)
MCHC: 31.4 g/dL (ref 30.0–36.0)
MCHC: 31.6 g/dL (ref 30.0–36.0)
MCV: 77.6 fL — AB (ref 78.0–100.0)
MCV: 76.6 fL — AB (ref 78.0–100.0)
PLATELETS: 340 10*3/uL (ref 150–400)
Platelets: 342 10*3/uL (ref 150–400)
RBC: 3.7 MIL/uL — AB (ref 3.87–5.11)
RBC: 3.72 MIL/uL — ABNORMAL LOW (ref 3.87–5.11)
RDW: 15.1 % (ref 11.5–15.5)
RDW: 14.8 % (ref 11.5–15.5)
WBC: 10 10*3/uL (ref 4.0–10.5)
WBC: 12.6 10*3/uL — AB (ref 4.0–10.5)

## 2018-02-04 LAB — TYPE AND SCREEN
ABO/RH(D): O POS
Antibody Screen: NEGATIVE

## 2018-02-04 LAB — ABO/RH: ABO/RH(D): O POS

## 2018-02-04 LAB — POCT FERN TEST: POCT Fern Test: POSITIVE

## 2018-02-04 LAB — RPR: RPR Ser Ql: NONREACTIVE

## 2018-02-04 SURGERY — Surgical Case
Anesthesia: Spinal | Site: Abdomen | Wound class: Clean Contaminated

## 2018-02-04 MED ORDER — LACTATED RINGERS IV BOLUS (SEPSIS)
300.0000 mL | Freq: Once | INTRAVENOUS | Status: AC
  Administered 2018-02-04: 300 mL via INTRAVENOUS

## 2018-02-04 MED ORDER — SIMETHICONE 80 MG PO CHEW
80.0000 mg | CHEWABLE_TABLET | ORAL | Status: DC | PRN
  Filled 2018-02-04: qty 1

## 2018-02-04 MED ORDER — BUPIVACAINE HCL (PF) 0.25 % IJ SOLN
INTRAMUSCULAR | Status: AC
  Filled 2018-02-04: qty 10

## 2018-02-04 MED ORDER — DIBUCAINE 1 % RE OINT
1.0000 "application " | TOPICAL_OINTMENT | RECTAL | Status: DC | PRN
  Filled 2018-02-04: qty 28

## 2018-02-04 MED ORDER — DEXAMETHASONE SODIUM PHOSPHATE 10 MG/ML IJ SOLN
INTRAMUSCULAR | Status: AC
  Filled 2018-02-04: qty 1

## 2018-02-04 MED ORDER — SCOPOLAMINE 1 MG/3DAYS TD PT72
MEDICATED_PATCH | TRANSDERMAL | Status: DC | PRN
  Administered 2018-02-04: 1 via TRANSDERMAL

## 2018-02-04 MED ORDER — PROMETHAZINE HCL 25 MG/ML IJ SOLN: 6.2500 mg | INTRAMUSCULAR | Status: DC | PRN

## 2018-02-04 MED ORDER — DIPHENHYDRAMINE HCL 25 MG PO CAPS
25.0000 mg | ORAL_CAPSULE | Freq: Four times a day (QID) | ORAL | Status: DC | PRN
  Administered 2018-02-04: 25 mg via ORAL
  Filled 2018-02-04 (×2): qty 1

## 2018-02-04 MED ORDER — NALOXONE HCL 0.4 MG/ML IJ SOLN: 0.4000 mg | INTRAMUSCULAR | Status: DC | PRN

## 2018-02-04 MED ORDER — IBUPROFEN 600 MG PO TABS
600.0000 mg | ORAL_TABLET | Freq: Four times a day (QID) | ORAL | Status: DC
  Administered 2018-02-04 – 2018-02-07 (×11): 600 mg via ORAL
  Filled 2018-02-04 (×12): qty 1

## 2018-02-04 MED ORDER — ACETAMINOPHEN 325 MG PO TABS: 650.0000 mg | ORAL_TABLET | ORAL | Status: DC | PRN

## 2018-02-04 MED ORDER — LACTATED RINGERS IV SOLN
INTRAVENOUS | Status: DC
  Administered 2018-02-04 (×3): via INTRAVENOUS

## 2018-02-04 MED ORDER — NALBUPHINE HCL 10 MG/ML IJ SOLN: 5.0000 mg | Freq: Once | INTRAMUSCULAR | Status: AC | PRN

## 2018-02-04 MED ORDER — NALBUPHINE HCL 10 MG/ML IJ SOLN
5.0000 mg | Freq: Once | INTRAMUSCULAR | Status: AC | PRN
  Administered 2018-02-04: 5 mg via INTRAVENOUS

## 2018-02-04 MED ORDER — MORPHINE SULFATE (PF) 0.5 MG/ML IJ SOLN
INTRAMUSCULAR | Status: AC
  Filled 2018-02-04: qty 10

## 2018-02-04 MED ORDER — OXYTOCIN 10 UNIT/ML IJ SOLN
INTRAVENOUS | Status: DC | PRN
  Administered 2018-02-04: 40 [IU] via INTRAVENOUS

## 2018-02-04 MED ORDER — MENTHOL 3 MG MT LOZG
1.0000 | LOZENGE | OROMUCOSAL | Status: DC | PRN
  Filled 2018-02-04: qty 9

## 2018-02-04 MED ORDER — MORPHINE SULFATE (PF) 0.5 MG/ML IJ SOLN
INTRAMUSCULAR | Status: DC | PRN
  Administered 2018-02-04: .2 mg via EPIDURAL
  Administered 2018-02-04: 4.8 mg via INTRAVENOUS

## 2018-02-04 MED ORDER — BUPIVACAINE HCL (PF) 0.25 % IJ SOLN
INTRAMUSCULAR | Status: DC | PRN
  Administered 2018-02-04: 20 mL

## 2018-02-04 MED ORDER — MEPERIDINE HCL 25 MG/ML IJ SOLN: 6.2500 mg | INTRAMUSCULAR | Status: DC | PRN

## 2018-02-04 MED ORDER — SENNOSIDES-DOCUSATE SODIUM 8.6-50 MG PO TABS
2.0000 | ORAL_TABLET | ORAL | Status: DC
  Administered 2018-02-04 – 2018-02-06 (×3): 2 via ORAL
  Filled 2018-02-04 (×5): qty 2

## 2018-02-04 MED ORDER — LACTATED RINGERS IV SOLN
INTRAVENOUS | Status: DC | PRN
  Administered 2018-02-04: 02:00:00 via INTRAVENOUS

## 2018-02-04 MED ORDER — KETOROLAC TROMETHAMINE 30 MG/ML IJ SOLN
INTRAMUSCULAR | Status: AC
  Filled 2018-02-04: qty 1

## 2018-02-04 MED ORDER — HYDROMORPHONE HCL 1 MG/ML IJ SOLN
0.2500 mg | INTRAMUSCULAR | Status: DC | PRN
  Administered 2018-02-04: 0.5 mg via INTRAVENOUS

## 2018-02-04 MED ORDER — DIPHENHYDRAMINE HCL 50 MG/ML IJ SOLN: 12.5000 mg | INTRAMUSCULAR | Status: DC | PRN

## 2018-02-04 MED ORDER — OXYTOCIN 40 UNITS IN LACTATED RINGERS INFUSION - SIMPLE MED
2.5000 [IU]/h | INTRAVENOUS | Status: AC
  Administered 2018-02-04: 2.5 [IU]/h via INTRAVENOUS
  Filled 2018-02-04: qty 1000

## 2018-02-04 MED ORDER — METHYLERGONOVINE MALEATE 0.2 MG/ML IJ SOLN: 0.2000 mg | INTRAMUSCULAR | Status: DC | PRN

## 2018-02-04 MED ORDER — HYDROMORPHONE HCL 1 MG/ML IJ SOLN
INTRAMUSCULAR | Status: AC
  Filled 2018-02-04: qty 0.5

## 2018-02-04 MED ORDER — SODIUM CHLORIDE 0.9% FLUSH: 3.0000 mL | INTRAVENOUS | Status: DC | PRN

## 2018-02-04 MED ORDER — NALBUPHINE HCL 10 MG/ML IJ SOLN: 5.0000 mg | INTRAMUSCULAR | Status: DC | PRN

## 2018-02-04 MED ORDER — METHYLERGONOVINE MALEATE 0.2 MG PO TABS: 0.2000 mg | ORAL_TABLET | ORAL | Status: DC | PRN

## 2018-02-04 MED ORDER — PRENATAL MULTIVITAMIN CH
1.0000 | ORAL_TABLET | Freq: Every day | ORAL | Status: DC
  Administered 2018-02-06 – 2018-02-07 (×2): 1 via ORAL
  Filled 2018-02-04 (×5): qty 1

## 2018-02-04 MED ORDER — ONDANSETRON HCL 4 MG/2ML IJ SOLN
INTRAMUSCULAR | Status: AC
  Filled 2018-02-04: qty 2

## 2018-02-04 MED ORDER — OXYCODONE-ACETAMINOPHEN 5-325 MG PO TABS
1.0000 | ORAL_TABLET | ORAL | Status: DC | PRN
Start: 2018-02-04 — End: 2018-02-07
  Administered 2018-02-05 – 2018-02-06 (×4): 1 via ORAL
  Filled 2018-02-04 (×4): qty 1

## 2018-02-04 MED ORDER — NALOXONE HCL 4 MG/10ML IJ SOLN
1.0000 ug/kg/h | INTRAMUSCULAR | Status: DC | PRN
  Filled 2018-02-04: qty 5

## 2018-02-04 MED ORDER — SCOPOLAMINE 1 MG/3DAYS TD PT72: 1.0000 | MEDICATED_PATCH | Freq: Once | TRANSDERMAL | Status: DC

## 2018-02-04 MED ORDER — NALBUPHINE HCL 10 MG/ML IJ SOLN
5.0000 mg | INTRAMUSCULAR | Status: DC | PRN
  Administered 2018-02-04: 5 mg via INTRAVENOUS
  Filled 2018-02-04 (×2): qty 1

## 2018-02-04 MED ORDER — ZOLPIDEM TARTRATE 5 MG PO TABS: 5.0000 mg | ORAL_TABLET | Freq: Every evening | ORAL | Status: DC | PRN

## 2018-02-04 MED ORDER — TETANUS-DIPHTH-ACELL PERTUSSIS 5-2.5-18.5 LF-MCG/0.5 IM SUSP
0.5000 mL | Freq: Once | INTRAMUSCULAR | Status: DC
  Filled 2018-02-04: qty 0.5

## 2018-02-04 MED ORDER — DEXAMETHASONE SODIUM PHOSPHATE 10 MG/ML IJ SOLN
INTRAMUSCULAR | Status: DC | PRN
  Administered 2018-02-04: 10 mg via INTRAVENOUS

## 2018-02-04 MED ORDER — DIPHENHYDRAMINE HCL 25 MG PO CAPS
25.0000 mg | ORAL_CAPSULE | ORAL | Status: DC | PRN
  Filled 2018-02-04: qty 1

## 2018-02-04 MED ORDER — SOD CITRATE-CITRIC ACID 500-334 MG/5ML PO SOLN
30.0000 mL | Freq: Once | ORAL | Status: AC
  Administered 2018-02-04: 30 mL via ORAL
  Filled 2018-02-04: qty 15

## 2018-02-04 MED ORDER — ONDANSETRON HCL 4 MG/2ML IJ SOLN
INTRAMUSCULAR | Status: DC | PRN
  Administered 2018-02-04: 4 mg via INTRAVENOUS

## 2018-02-04 MED ORDER — SCOPOLAMINE 1 MG/3DAYS TD PT72
MEDICATED_PATCH | TRANSDERMAL | Status: AC
  Filled 2018-02-04: qty 1

## 2018-02-04 MED ORDER — WITCH HAZEL-GLYCERIN EX PADS: 1.0000 "application " | MEDICATED_PAD | CUTANEOUS | Status: DC | PRN

## 2018-02-04 MED ORDER — PHENYLEPHRINE 40 MCG/ML (10ML) SYRINGE FOR IV PUSH (FOR BLOOD PRESSURE SUPPORT)
PREFILLED_SYRINGE | INTRAVENOUS | Status: AC
  Filled 2018-02-04: qty 10

## 2018-02-04 MED ORDER — LABETALOL HCL 200 MG PO TABS
200.0000 mg | ORAL_TABLET | Freq: Two times a day (BID) | ORAL | Status: DC
  Administered 2018-02-04 – 2018-02-06 (×4): 200 mg via ORAL
  Filled 2018-02-04 (×10): qty 1

## 2018-02-04 MED ORDER — COCONUT OIL OIL
1.0000 "application " | TOPICAL_OIL | Status: DC | PRN
  Filled 2018-02-04 (×2): qty 120

## 2018-02-04 MED ORDER — SIMETHICONE 80 MG PO CHEW
80.0000 mg | CHEWABLE_TABLET | ORAL | Status: DC
  Administered 2018-02-04 – 2018-02-06 (×2): 80 mg via ORAL
  Filled 2018-02-04 (×4): qty 1

## 2018-02-04 MED ORDER — PHENYLEPHRINE 8 MG IN D5W 100 ML (0.08MG/ML) PREMIX OPTIME
INJECTION | INTRAVENOUS | Status: DC | PRN
  Administered 2018-02-04: 60 ug/min via INTRAVENOUS

## 2018-02-04 MED ORDER — OXYCODONE-ACETAMINOPHEN 5-325 MG PO TABS: 2.0000 | ORAL_TABLET | ORAL | Status: DC | PRN

## 2018-02-04 MED ORDER — PHENYLEPHRINE HCL 10 MG/ML IJ SOLN
INTRAMUSCULAR | Status: DC | PRN
  Administered 2018-02-04: 80 ug via INTRAVENOUS

## 2018-02-04 MED ORDER — OXYTOCIN 10 UNIT/ML IJ SOLN
INTRAMUSCULAR | Status: AC
  Filled 2018-02-04: qty 4

## 2018-02-04 MED ORDER — BUPIVACAINE IN DEXTROSE 0.75-8.25 % IT SOLN
INTRATHECAL | Status: DC | PRN
  Administered 2018-02-04: 1.6 mL via INTRATHECAL

## 2018-02-04 MED ORDER — KETOROLAC TROMETHAMINE 30 MG/ML IJ SOLN
30.0000 mg | Freq: Once | INTRAMUSCULAR | Status: DC | PRN
  Administered 2018-02-04: 30 mg via INTRAVENOUS

## 2018-02-04 MED ORDER — SIMETHICONE 80 MG PO CHEW
80.0000 mg | CHEWABLE_TABLET | Freq: Three times a day (TID) | ORAL | Status: DC
  Administered 2018-02-04 – 2018-02-06 (×5): 80 mg via ORAL
  Filled 2018-02-04 (×12): qty 1

## 2018-02-04 MED ORDER — LACTATED RINGERS IV SOLN
INTRAVENOUS | Status: DC | PRN
  Administered 2018-02-04: 03:00:00 via INTRAVENOUS

## 2018-02-04 MED ORDER — DEXTROSE 5 % IV SOLN
INTRAVENOUS | Status: DC | PRN
  Administered 2018-02-04: 3 g via INTRAVENOUS

## 2018-02-04 MED ORDER — ONDANSETRON HCL 4 MG/2ML IJ SOLN: 4.0000 mg | Freq: Three times a day (TID) | INTRAMUSCULAR | Status: DC | PRN

## 2018-02-04 SURGICAL SUPPLY — 41 items
ADH SKN CLS LQ APL DERMABOND (GAUZE/BANDAGES/DRESSINGS) ×1
CHLORAPREP W/TINT 26ML (MISCELLANEOUS) ×3 IMPLANT
CLAMP CORD UMBIL (MISCELLANEOUS) IMPLANT
CLOTH BEACON ORANGE TIMEOUT ST (SAFETY) ×3 IMPLANT
DERMABOND ADHESIVE PROPEN (GAUZE/BANDAGES/DRESSINGS) ×2
DERMABOND ADVANCED .7 DNX6 (GAUZE/BANDAGES/DRESSINGS) IMPLANT
DRSG OPSITE POSTOP 4X10 (GAUZE/BANDAGES/DRESSINGS) ×3 IMPLANT
ELECT REM PT RETURN 9FT ADLT (ELECTROSURGICAL) ×3
ELECTRODE REM PT RTRN 9FT ADLT (ELECTROSURGICAL) ×1 IMPLANT
EXTRACTOR VACUUM M CUP 4 TUBE (SUCTIONS) IMPLANT
EXTRACTOR VACUUM M CUP 4' TUBE (SUCTIONS)
GAUZE SPONGE 4X4 12PLY STRL (GAUZE/BANDAGES/DRESSINGS) ×2 IMPLANT
GLOVE BIO SURGEON STRL SZ7.5 (GLOVE) ×3 IMPLANT
GLOVE BIOGEL PI IND STRL 7.0 (GLOVE) ×1 IMPLANT
GLOVE BIOGEL PI INDICATOR 7.0 (GLOVE) ×2
GOWN STRL REUS W/TWL LRG LVL3 (GOWN DISPOSABLE) ×6 IMPLANT
KIT ABG SYR 3ML LUER SLIP (SYRINGE) IMPLANT
NDL HYPO 25X5/8 SAFETYGLIDE (NEEDLE) IMPLANT
NDL SPNL 20GX3.5 QUINCKE YW (NEEDLE) IMPLANT
NEEDLE HYPO 22GX1.5 SAFETY (NEEDLE) ×3 IMPLANT
NEEDLE HYPO 25X5/8 SAFETYGLIDE (NEEDLE) IMPLANT
NEEDLE SPNL 20GX3.5 QUINCKE YW (NEEDLE) IMPLANT
NS IRRIG 1000ML POUR BTL (IV SOLUTION) ×3 IMPLANT
PACK C SECTION WH (CUSTOM PROCEDURE TRAY) ×3 IMPLANT
PAD ABD 8X10 STRL (GAUZE/BANDAGES/DRESSINGS) ×2 IMPLANT
PENCIL SMOKE EVAC W/HOLSTER (ELECTROSURGICAL) ×3 IMPLANT
RETRACTOR TRAXI PANNICULUS (MISCELLANEOUS) IMPLANT
SUT MNCRL 0 VIOLET CTX 36 (SUTURE) ×2 IMPLANT
SUT MNCRL AB 3-0 PS2 27 (SUTURE) ×2 IMPLANT
SUT MON AB 2-0 CT1 27 (SUTURE) ×3 IMPLANT
SUT MON AB-0 CT1 36 (SUTURE) ×8 IMPLANT
SUT MONOCRYL 0 CTX 36 (SUTURE) ×4
SUT PLAIN 0 NONE (SUTURE) IMPLANT
SUT PLAIN 2 0 (SUTURE)
SUT PLAIN 2 0 XLH (SUTURE) ×2 IMPLANT
SUT PLAIN ABS 2-0 CT1 27XMFL (SUTURE) IMPLANT
SYR 20CC LL (SYRINGE) IMPLANT
SYR CONTROL 10ML LL (SYRINGE) ×3 IMPLANT
TOWEL OR 17X24 6PK STRL BLUE (TOWEL DISPOSABLE) ×3 IMPLANT
TRAXI PANNICULUS RETRACTOR (MISCELLANEOUS) ×2
TRAY FOLEY BAG SILVER LF 14FR (SET/KITS/TRAYS/PACK) ×3 IMPLANT

## 2018-02-04 NOTE — H&P (Signed)
Morgan Colon is a 37 y.o. female presenting for SROM and breech. OB History    Gravida  2   Para  1   Term  1   Preterm      AB      Living  1     SAB      TAB      Ectopic      Multiple      Live Births             History reviewed. No pertinent past medical history. No past surgical history on file. Family History: family history includes Diabetes in her mother; Hypertension in her mother. Social History:  reports that she has never smoked. She has never used smokeless tobacco. She reports that she does not drink alcohol or use drugs.     Maternal Diabetes: No Genetic Screening: Normal Maternal Ultrasounds/Referrals: Normal Fetal Ultrasounds or other Referrals:  None Maternal Substance Abuse:  No Significant Maternal Medications:  None Significant Maternal Lab Results:  None Other Comments:  None  Review of Systems  Constitutional: Negative.   All other systems reviewed and are negative.  Maternal Medical History:  Reason for admission: Rupture of membranes.   Contractions: Onset was 1-2 hours ago.   Frequency: irregular.   Perceived severity is mild.    Fetal activity: Perceived fetal activity is normal.   Last perceived fetal movement was within the past hour.    Prenatal complications: PIH.   Prenatal Complications - Diabetes: none.    Dilation: Fingertip Effacement (%): Thick Exam by:: Latricia HeftAnna Cioce, RN Blood pressure 136/81, pulse (!) 101, temperature 98.5 F (36.9 C), resp. rate 19, height 5\' 4"  (1.626 m), weight 120.1 kg (264 lb 12 oz), SpO2 100 %. Maternal Exam:  Uterine Assessment: Contraction strength is mild.  Contraction frequency is irregular.   Abdomen: Patient reports no abdominal tenderness. Fetal presentation: breech  Introitus: Normal vulva. Normal vagina.  Ferning test: positive.  Nitrazine test: positive. Amniotic fluid character: clear.  Pelvis: questionable for delivery.   Cervix: Cervix evaluated by digital exam.      Physical Exam  Nursing note and vitals reviewed. Constitutional: She is oriented to person, place, and time. She appears well-developed and well-nourished.  HENT:  Head: Normocephalic.  Neck: Normal range of motion. Neck supple.  Cardiovascular: Normal rate and regular rhythm.  Respiratory: Effort normal and breath sounds normal.  GI: Soft. Bowel sounds are normal.  Genitourinary: Vagina normal and uterus normal.  Musculoskeletal: Normal range of motion.  Neurological: She is alert and oriented to person, place, and time. She has normal reflexes.  Skin: Skin is warm and dry.  Psychiatric: She has a normal mood and affect.    Prenatal labs: ABO, Rh:   Antibody:   Rubella:   RPR:    HBsAg:    HIV:    GBS:     Assessment/Plan: 37 week IUP SROM  Breech Maternal obesity Gestational HTN Unstable lie at high risk for cord prolapse  Proceed with urgent csection .  Risks vs benefits discussed. Consent done.  Nuel Dejaynes J 02/04/2018, 1:46 AM

## 2018-02-04 NOTE — Op Note (Signed)
Cesarean Section Procedure Note  Indications: malpresentation: frank breech  Pre-operative Diagnosis: 37 week 2 day pregnancy.  Post-operative Diagnosis: same  Surgeon: Lenoard AdenAAVON,Akim Watkinson J   Assistants: Fredric MareBailey, CNM  Anesthesia: Local anesthesia 0.25.% bupivacaine and Spinal anesthesia  ASA Class: 2  Procedure Details  The patient was seen in the Holding Room. The risks, benefits, complications, treatment options, and expected outcomes were discussed with the patient.  The patient concurred with the proposed plan, giving informed consent. The risks of anesthesia, infection, bleeding and possible injury to other organs discussed. Injury to bowel, bladder, or ureter with possible need for repair discussed. Possible need for transfusion with secondary risks of hepatitis or HIV acquisition discussed. Post operative complications to include but not limited to DVT, PE and Pneumonia noted. The site of surgery properly noted/marked. The patient was taken to Operating Room # 9, identified as Baruch MerlLauren R Lafauci and the procedure verified as C-Section Delivery. A Time Out was held and the above information confirmed.  After induction of anesthesia, the patient was draped and prepped in the usual sterile manner. A Pfannenstiel incision was made and carried down through the subcutaneous tissue to the fascia. Fascial incision was made and extended transversely using Mayo scissors. The fascia was separated from the underlying rectus tissue superiorly and inferiorly. The peritoneum was identified and entered. Peritoneal incision was extended longitudinally. The utero-vesical peritoneal reflection was incised transversely and the bladder flap was bluntly freed from the lower uterine segment. A low transverse uterine incision(Kerr hysterotomy) was made. Delivered from frank breech presentation was a  female with Apgar scores of 8 at one minute and 9 at five minutes. Bulb suctioning gently performed. Neonatal team in  attendance.After the umbilical cord was clamped and cut cord blood was obtained for evaluation. The placenta was removed intact and appeared normal. The uterus was curetted with a dry lap pack. Good hemostasis was noted.The uterine outline, tubes and ovaries appeared normal. The uterine incision was closed with running locked sutures of 0 Monocryl x 2 layers. Hemostasis was observed. The parietal peritoneum was closed with a running 2-0 Monocryl suture. The fascia was then reapproximated with running sutures of 0 Monocryl. The skin was reapproximated with 3-0 monocryl after [place] closure with 2-0 plain.  Instrument, sponge, and needle counts were correct prior the abdominal closure and at the conclusion of the case.   Findings: FTLM, breech , right lateral placenta  Estimated Blood Loss:  800         Drains: foley                 Specimens: placenta                 Complications:  None; patient tolerated the procedure well.         Disposition: PACU - hemodynamically stable.         Condition: stable  Attending Attestation: I performed the procedure.

## 2018-02-04 NOTE — Anesthesia Preprocedure Evaluation (Addendum)
Anesthesia Evaluation  Patient identified by MRN, date of birth, ID band Patient awake    Reviewed: Allergy & Precautions, NPO status , Patient's Chart, lab work & pertinent test results  Airway Mallampati: III  TM Distance: >3 FB Neck ROM: Full    Dental no notable dental hx.    Pulmonary neg pulmonary ROS,    Pulmonary exam normal breath sounds clear to auscultation       Cardiovascular negative cardio ROS Normal cardiovascular exam Rhythm:Regular Rate:Normal     Neuro/Psych negative neurological ROS  negative psych ROS   GI/Hepatic negative GI ROS, Neg liver ROS,   Endo/Other  Morbid obesity  Renal/GU negative Renal ROS     Musculoskeletal negative musculoskeletal ROS (+)   Abdominal (+) + obese,   Peds  Hematology negative hematology ROS (+)   Anesthesia Other Findings   Reproductive/Obstetrics (+) Pregnancy                             Anesthesia Physical Anesthesia Plan  ASA: III  Anesthesia Plan: Spinal   Post-op Pain Management:    Induction:   PONV Risk Score and Plan: 4 or greater and Ondansetron, Dexamethasone and Scopolamine patch - Pre-op  Airway Management Planned:   Additional Equipment:   Intra-op Plan:   Post-operative Plan:   Informed Consent: I have reviewed the patients History and Physical, chart, labs and discussed the procedure including the risks, benefits and alternatives for the proposed anesthesia with the patient or authorized representative who has indicated his/her understanding and acceptance.   Dental advisory given  Plan Discussed with: CRNA  Anesthesia Plan Comments: (Pt NPO at 2100 except some clear liquids > 2 hrs. Dr. Billy Coastaavon states this is emergent and cannot wait for proper fasting times.)       Anesthesia Quick Evaluation

## 2018-02-04 NOTE — Anesthesia Postprocedure Evaluation (Signed)
Anesthesia Post Note  Patient: Morgan Colon  Procedure(s) Performed: CESAREAN SECTION (N/A Abdomen)     Patient location during evaluation: Mother Baby Anesthesia Type: Spinal Level of consciousness: awake and alert, oriented and patient cooperative Pain management: pain level controlled Vital Signs Assessment: post-procedure vital signs reviewed and stable Respiratory status: spontaneous breathing Cardiovascular status: stable Postop Assessment: no headache, epidural receding, patient able to bend at knees and no signs of nausea or vomiting Anesthetic complications: no Comments: Pain score 1.    Last Vitals:  Vitals:   02/04/18 1215 02/04/18 1220  BP: (!) 141/80 (!) 147/96  Pulse: 69 72  Resp: 16   Temp: (!) 36.1 C   SpO2: 93%     Last Pain:  Vitals:   02/04/18 1215  TempSrc: Axillary  PainSc: 0-No pain   Pain Goal:                 [facility] CenterWRINKLE,Morgan Colon

## 2018-02-04 NOTE — Progress Notes (Signed)
Patient has a history of PIH with this pregnancy.   Patient's current blood pressure was 115/63 (22:28) and 119/62 (2000) . PIH assessment WNL.   RN called Midwife Fredric MareBailey regarding BP medicine.  Midwife Fredric MareBailey gave order to hold Blood pressure medicine until am then will reassess.

## 2018-02-04 NOTE — MAU Note (Signed)
ROM at 0000-clear fluid.  No VB.  Endorses + FM though not as much as before.  Gestational HTN.  GBS neg.  Scheduled for version on Monday.

## 2018-02-04 NOTE — Transfer of Care (Signed)
Immediate Anesthesia Transfer of Care Note  Patient: Morgan Colon  Procedure(s) Performed: CESAREAN SECTION (N/A Abdomen)  Patient Location: PACU  Anesthesia Type:Spinal  Level of Consciousness: awake  Airway & Oxygen Therapy: Patient Spontanous Breathing  Post-op Assessment: Report given to RN and Post -op Vital signs reviewed and stable  Post vital signs: Reviewed and stable  Last Vitals:  Vitals Value Taken Time  BP 119/97 02/04/2018  4:03 AM  Temp    Pulse 84 02/04/2018  4:04 AM  Resp 21 02/04/2018  4:04 AM  SpO2 97 % 02/04/2018  4:04 AM  Vitals shown include unvalidated device data.  Last Pain:  Vitals:   02/04/18 0049  PainSc: 0-No pain         Complications: No apparent anesthesia complications

## 2018-02-04 NOTE — Progress Notes (Signed)
Subjective: POD# 0 Information for the patient's newborn:  Morgan Colon, Morgan Colon [[id number]][[id number]]  female    Baby name: "Nova"  Reports feeling "Exhausted" Feeding: breast Patient reports tolerating PO.  Breast symptoms: None Pain controlled withDuramorph Denies HA/SOB/C/P/N/V/dizziness. Flatus absent. She reports vaginal bleeding as normal, without clots.  She is ambulating, urinating without difficulty.     Objective:   VS:    Vitals:   02/04/18 1044 02/04/18 1047 02/04/18 1215 02/04/18 1220  BP: 136/78  (!) 141/80 (!) 147/96  Pulse: 70  69 72  Resp:  16 16   Temp:   (!) 97 F (36.1 C)   TempSrc:   Axillary   SpO2:  95% 93%   Weight:      Height:          Intake/Output Summary (Last 24 hours) at 02/04/2018 1250 Last data filed at 02/04/2018 1215 Gross per 24 hour  Intake 1740 ml  Output 1004 ml  Net 736 ml        Recent Labs    02/04/18 0145 02/04/18 0549  WBC 10.0 12.6*  HGB 9.0* 9.0*  HCT 28.7* 28.5*  PLT 342 340     Blood type: --/--/O POS, O POS  Rubella:  Immune    Physical Exam:  General: alert, cooperative and no distress CV: Regular rate and rhythm Resp: clear Abdomen: soft, nontender, hypoactive bowel sounds Incision: clean, dry and intact Uterine Fundus: firm, below umbilicus, nontender Lochia: minimal Ext: edema 2+ to BLE, SCD's on       Assessment/Plan: 37 y.o.   POD# 0. [id number]                  Principal Problem:   Postpartum care following cesarean delivery (3/23)  Advance diet as tolerated  Encourage rest when baby rests  Breastfeeding support  Encourage to ambulate when stable  Routine post-op care Pregnancy induced IDA  Repeat CBC in AM Chronic HTN  Continue Labetalol  Repeat CMP in AM  Monitor for S/S of pre-eclampsia   Tresa GarterVivian Grice,SNM 02/04/2018, 12:50 PM

## 2018-02-04 NOTE — MAU Provider Note (Signed)
Pt informed that the ultrasound is considered a limited OB ultrasound and is not intended to be a complete ultrasound exam.  Patient also informed that the ultrasound is not being completed with the intent of assessing for fetal or placental anomalies or any pelvic abnormalities.  Explained that the purpose of today's ultrasound is to assess for  presentation.  Patient acknowledges the purpose of the exam and the limitations of the study.   Breech presentation noted on US as expected based on patient's previous exams.  RN to notify Dr. Billy Coastaavon.   Morgan Colon, Morgan Colon N, PA-C 02/04/2018 1:24 AM

## 2018-02-04 NOTE — Lactation Note (Signed)
This note was copied from a baby's chart. Lactation Consultation Note  Patient Name: Morgan Clarita LeberLauren Colon Today's Date: 02/04/2018 Reason for consult: Initial assessment;1st time breastfeeding;Early term 37-38.6wks Breastfeeding consultation services and support information given to patient.  Newborn is 211 hours old and breastfeeding well per mom.  This is mom's first time breastfeeding.  Her older child is now 10415.  Instructed to feed with any feeding cue and to call for assist prn. Mom asking how we know if baby is getting enough milk. Reassured mom we are watching baby for adequate diapers, contentment after feeds and daily weights.  Maternal Data Does the patient have breastfeeding experience prior to this delivery?: No  Feeding Feeding Type: Breast Fed Length of feed: 20 min  LATCH Score                   Interventions    Lactation Tools Discussed/Used     Consult Status Consult Status: Follow-up Date: 02/05/18 Follow-up type: In-patient    Huston FoleyMOULDEN, [name] 02/04/2018, 2:58 PM

## 2018-02-04 NOTE — Anesthesia Procedure Notes (Signed)
Spinal  Patient location during procedure: OR Staffing Performed: anesthesiologist  Preanesthetic Checklist Completed: patient identified, site marked, surgical consent, pre-op evaluation, timeout performed, IV checked, risks and benefits discussed and monitors and equipment checked Spinal Block Patient position: sitting Prep: site prepped and draped and DuraPrep Patient monitoring: heart rate, continuous pulse ox and blood pressure Approach: midline Injection technique: single-shot Needle Needle type: Sprotte  Needle gauge: 24 G Needle length: 9 cm Assessment Sensory level: T6 Events: paresthesia Additional Notes Expiration date of kit checked and confirmed. Patient tolerated procedure well, without complications. CSF obtained, but could not aspirate fluid. Pt. Complained of R sided paresthesia. Needle withdrawn and repeated dural puncture. Free flowing CSF, good aspirate.

## 2018-02-05 ENCOUNTER — Encounter (HOSPITAL_COMMUNITY): Payer: Self-pay | Admitting: *Deleted

## 2018-02-05 DIAGNOSIS — D62 Acute posthemorrhagic anemia: Secondary | ICD-10-CM

## 2018-02-05 HISTORY — DX: Acute posthemorrhagic anemia: D62

## 2018-02-05 LAB — COMPREHENSIVE METABOLIC PANEL
ALT: 15 U/L (ref 14–54)
AST: 28 U/L (ref 15–41)
Albumin: 2 g/dL — ABNORMAL LOW (ref 3.5–5.0)
Alkaline Phosphatase: 118 U/L (ref 38–126)
Anion gap: 8 (ref 5–15)
BUN: 13 mg/dL (ref 6–20)
CO2: 22 mmol/L (ref 22–32)
Calcium: 8.5 mg/dL — ABNORMAL LOW (ref 8.9–10.3)
Chloride: 101 mmol/L (ref 101–111)
Creatinine, Ser: 0.85 mg/dL (ref 0.44–1.00)
GFR calc Af Amer: >60 mL/min (ref >60)
GFR calc non Af Amer: >60 mL/min (ref >60)
Glucose, Bld: 94 mg/dL (ref 65–99)
Potassium: 4.3 mmol/L (ref 3.5–5.1)
Sodium: 131 mmol/L — ABNORMAL LOW (ref 135–145)
Total Bilirubin: 0.5 mg/dL (ref 0.3–1.2)
Total Protein: 4.7 g/dL — ABNORMAL LOW (ref 6.5–8.1)

## 2018-02-05 LAB — CBC
HCT: 23.8 % — ABNORMAL LOW (ref 36.0–46.0)
Hemoglobin: 7.5 g/dL — ABNORMAL LOW (ref 12.0–15.0)
MCH: 24.5 pg — ABNORMAL LOW (ref 26.0–34.0)
MCHC: 31.5 g/dL (ref 30.0–36.0)
MCV: 77.8 fL — ABNORMAL LOW (ref 78.0–100.0)
Platelets: 276 10*3/uL (ref 150–400)
RBC: 3.06 MIL/uL — ABNORMAL LOW (ref 3.87–5.11)
RDW: 15.2 % (ref 11.5–15.5)
WBC: 15 10*3/uL — ABNORMAL HIGH (ref 4.0–10.5)

## 2018-02-05 MED ORDER — MAGNESIUM OXIDE 400 (241.3 MG) MG PO TABS
400.0000 mg | ORAL_TABLET | Freq: Every day | ORAL | Status: DC
  Administered 2018-02-05 – 2018-02-07 (×3): 400 mg via ORAL
  Filled 2018-02-05 (×4): qty 1

## 2018-02-05 MED ORDER — FERUMOXYTOL INJECTION 510 MG/17 ML
510.0000 mg | Freq: Once | INTRAVENOUS | Status: AC
  Administered 2018-02-05: 510 mg via INTRAVENOUS
  Filled 2018-02-05: qty 17

## 2018-02-05 MED ORDER — HYDROCHLOROTHIAZIDE 25 MG PO TABS
25.0000 mg | ORAL_TABLET | Freq: Once | ORAL | Status: AC
  Administered 2018-02-05: 25 mg via ORAL
  Filled 2018-02-05 (×2): qty 1

## 2018-02-05 MED ORDER — POLYSACCHARIDE IRON COMPLEX 150 MG PO CAPS
150.0000 mg | ORAL_CAPSULE | Freq: Two times a day (BID) | ORAL | Status: DC
  Administered 2018-02-05 – 2018-02-07 (×4): 150 mg via ORAL
  Filled 2018-02-05 (×4): qty 1

## 2018-02-05 NOTE — Progress Notes (Signed)
Subjective: POD# 1 Information for the patient's newborn:  Zadie CleverlyHarris, Girl Tejasvi [[id number]][[id number]]  female  Baby name: "Sander Radonova"  Reports feeling "still tired, but a little better" Feeding: breast, cluster fed last night Patient reports tolerating PO.  Breast symptoms: None, colostrum present Pain controlled withPO meds Denies HA/SOB/C/P/N/V/dizziness. Flatus Not present. She reports vaginal bleeding as normal, without clots.  She is ambulating, urinating without difficulty.     Objective:   VS:    Vitals:   02/04/18 2345 02/05/18 0226 02/05/18 0358 02/05/18 0540  BP: 122/74 (!) 150/96 117/72 113/69  Pulse: 84 78 87 75  Resp: 18 18 18 18   Temp: 98.6 F (37 C) 97.7 F (36.5 C) 98 F (36.7 C) 98 F (36.7 C)  TempSrc: Axillary Axillary Axillary Axillary  SpO2: 97% 97% 97% 98%  Weight:      Height:          Intake/Output Summary (Last 24 hours) at 02/05/2018 1051 Last data filed at 02/05/2018 1016 Gross per 24 hour  Intake 4672.71 ml  Output 1665 ml  Net 3007.71 ml        Recent Labs    02/04/18 0549 02/05/18 0551  WBC 12.6* 15.0*  HGB 9.0* 7.5*  HCT 28.5* 23.8*  PLT 340 276   CMP     Component Value Date/Time   NA 131 (L) 02/05/2018 0551   K 4.3 02/05/2018 0551   CL 101 02/05/2018 0551   CO2 22 02/05/2018 0551   GLUCOSE 94 02/05/2018 0551   BUN 13 02/05/2018 0551   CREATININE 0.85 02/05/2018 0551   CALCIUM 8.5 (L) 02/05/2018 0551   PROT 4.7 (L) 02/05/2018 0551   ALBUMIN 2.0 (L) 02/05/2018 0551   AST 28 02/05/2018 0551   ALT 15 02/05/2018 0551   ALKPHOS 118 02/05/2018 0551   BILITOT 0.5 02/05/2018 0551   GFRNONAA >60 02/05/2018 0551   GFRAA >60 02/05/2018 0551     Blood type: --/--/O POS, O POS (03/23 0145)  Rubella: Immune    Physical Exam:  General: alert, cooperative and no distress CV: Regular rate and rhythm, normotensive Resp: clear Abdomen: soft, nontender, normal bowel sounds Incision: clean, dry, intact and Foam tape Uterine Fundus: firm,  below umbilicus, nontender Lochia: minimal Ext: Trace edema to BLE, no calf tenderness  Assessment 36 y.o.   POD# 1. [id number]                  Principal Problem:   Postpartum care following cesarean delivery (3/23)  Active Problems:   Cesarean delivery for breech presentation    Anemia-Chronic anemia compounded with SVD   CHTN  Plan       Continue routine post-op postpartum care Encourage to ambulate Niferex 150 mg PO BID and Mag oxide 400 mg PO daily started Monitor HR and BP and report S/S hypovolemia Continue Labetalol as ordered  Rhea PinkVivian Grice, SNM 02/05/2018, 10:51 AM

## 2018-02-05 NOTE — Progress Notes (Signed)
Rn called Fredric MareBailey CNM due to patent's increase in blood pressure due to sudden severe toothache. Orders were given to give Blood pressure medicine.

## 2018-02-06 ENCOUNTER — Inpatient Hospital Stay (HOSPITAL_COMMUNITY): Payer: BC Managed Care – PPO

## 2018-02-06 ENCOUNTER — Encounter (HOSPITAL_COMMUNITY): Payer: Self-pay | Admitting: Obstetrics and Gynecology

## 2018-02-06 DIAGNOSIS — D62 Acute posthemorrhagic anemia: Secondary | ICD-10-CM

## 2018-02-06 DIAGNOSIS — O139 Gestational [pregnancy-induced] hypertension without significant proteinuria, unspecified trimester: Secondary | ICD-10-CM

## 2018-02-06 LAB — COMPREHENSIVE METABOLIC PANEL
ALT: 16 U/L (ref 14–54)
AST: 28 U/L (ref 15–41)
Albumin: 2 g/dL — ABNORMAL LOW (ref 3.5–5.0)
Alkaline Phosphatase: 112 U/L (ref 38–126)
Anion gap: 7 (ref 5–15)
BUN: 9 mg/dL (ref 6–20)
CO2: 23 mmol/L (ref 22–32)
Calcium: 8.3 mg/dL — ABNORMAL LOW (ref 8.9–10.3)
Chloride: 106 mmol/L (ref 101–111)
Creatinine, Ser: 0.63 mg/dL (ref 0.44–1.00)
GFR calc Af Amer: >60 mL/min (ref >60)
GFR calc non Af Amer: >60 mL/min (ref >60)
Glucose, Bld: 83 mg/dL (ref 65–99)
Potassium: 4 mmol/L (ref 3.5–5.1)
Sodium: 136 mmol/L (ref 135–145)
Total Bilirubin: 0.5 mg/dL (ref 0.3–1.2)
Total Protein: 4.9 g/dL — ABNORMAL LOW (ref 6.5–8.1)

## 2018-02-06 LAB — CBC
HCT: 24.2 % — ABNORMAL LOW (ref 36.0–46.0)
Hemoglobin: 7.6 g/dL — ABNORMAL LOW (ref 12.0–15.0)
MCH: 24.4 pg — ABNORMAL LOW (ref 26.0–34.0)
MCHC: 31.4 g/dL (ref 30.0–36.0)
MCV: 77.6 fL — ABNORMAL LOW (ref 78.0–100.0)
Platelets: 304 10*3/uL (ref 150–400)
RBC: 3.12 MIL/uL — ABNORMAL LOW (ref 3.87–5.11)
RDW: 15.3 % (ref 11.5–15.5)
WBC: 13.3 10*3/uL — ABNORMAL HIGH (ref 4.0–10.5)

## 2018-02-06 LAB — URIC ACID: Uric Acid, Serum: 4.9 mg/dL (ref 2.3–6.6)

## 2018-02-06 MED ORDER — LABETALOL HCL 200 MG PO TABS
400.0000 mg | ORAL_TABLET | Freq: Two times a day (BID) | ORAL | Status: DC
  Administered 2018-02-06 – 2018-02-07 (×3): 400 mg via ORAL
  Filled 2018-02-06 (×3): qty 2

## 2018-02-06 NOTE — Progress Notes (Addendum)
POSTOPERATIVE DAY # 2 S/P Primary LTCS for breech with SROM, chronic hypertension, baby girl "[place]ova"   S:         Reports feeling okay, very tired; reports only sleeping 2 hours last night.  Feels some tension and muscle soreness in shoulders/neck.  Reports incisional pain is manageable              Tolerating po intake / no nausea / no vomiting / + flatus / no BM  Denies HA, visual changes, RUQ/epigastric pain   Denies dizziness, SOB, or CP             Bleeding is light             Pain controlled with Motrin and Percocet             Up ad lib / ambulatory/ voiding QS  Newborn breast feeding with formula supplementation due to infant's weight loss.  Mom reports baby is latching well, but has low colostrum.  Desires to begin pumping. Goal is to get back to exclusively breastfeeding.    O:  VS: BP (!) 152/92 (BP Location: Right Arm)   Pulse 73   Temp 98.3 F (36.8 C) (Oral)   Resp 17   Ht 5\' 4"  (1.626 m)   Wt 120.1 kg (264 lb 12 oz)   SpO2 96%   Breastfeeding? Unknown   BMI 45.44 kg/m    LABS:               Recent Labs    02/05/18 0551 02/06/18 0800  WBC 15.0* 13.3*  HGB 7.5* 7.6*  PLT 276 304               Bloodtype: --/--/O POS, O POS Performed at [facility], [address] [place], [place] [address]  [phone](03/23 0145)  Rubella:     Immune  Declined flu and tdap                                           I&O: Intake/Output      03/24 0701 - 03/25 0700 03/25 0701 - 03/26 0700   P.O. 3180    I.V. (mL/kg)     IV Piggyback     Total Intake(mL/kg) 3180 (26.5)    Urine (mL/kg/hr) 4335 (1.5)    Total Output 4335    Net -1155                      Physical Exam:             Alert and Oriented X3  Lungs: Clear and unlabored  Heart: regular rate and rhythm / no murmurs  Abdomen: soft, non-tender, non-distended, active bowel sounds in all quadrants              Fundus: firm, non-tender, U-1             Dressing: pressure dressing removed by me; honeycomb dsg c/d/  dressing not adherent at the bottom;             Incision:  approximated with sutures / no erythema / no ecchymosis / no drainage  Perineum: intact; vulvar edema noted   Lochia: scant, no clots   Extremities: upper thigh edema, trace pedal edema; no calf pain or tenderness, +1 DTRs; no clonus bilaterally   A/P:    POD #  2 S/P Primary LTCS for breech with SROM  ABL Anemia    - s/p IV Feraheme on 3/24   - Stable on Niferex 150mg  BID            Chronic Hypertension    - s/p 1 dose HCTZ 25mg  yesterday    - Net positive since admission; continue strict I&Os   - PIH labs stable this morning   - Labile blood pressures for past 24 hours   - Increase Labetalol 400mg  BID   - Watch for s/s of pre-eclampsia   Routine postoperative care              See lactation today   Set up DEBP today   May shower today   Change honeycomb dressing after shower  Anticipate discharge home tomorrow    Consult for plan of care: Dr. Lillie Columbia, MSN, CNM [facility]

## 2018-02-06 NOTE — Progress Notes (Signed)
Called Marlinda Mikeanya Bailey CNM regarding pt's BP of 161/88 at [phone]. No S&S PIH, pt was sleeping prior to BP check. Order received for labs for the morning.

## 2018-02-06 NOTE — Lactation Note (Signed)
This note was copied from a baby's chart. Lactation Consultation Note  Patient Name: Morgan Clarita LeberLauren John Today's Date: 02/06/2018   Mom says pumping is "tedious." I noticed that she was pumping with size 27 flanges & I offered to see if those were the proper fit, but she declined.   Lurline HareRichey, Joselyn Edling [facility] 02/06/2018, 10:30 PM

## 2018-02-07 MED ORDER — OXYCODONE-ACETAMINOPHEN 5-325 MG PO TABS: 1.0000 | ORAL_TABLET | ORAL | 0 refills | Status: DC | PRN

## 2018-02-07 MED ORDER — IBUPROFEN 600 MG PO TABS: 600.0000 mg | ORAL_TABLET | Freq: Four times a day (QID) | ORAL | 0 refills | Status: AC

## 2018-02-07 MED ORDER — MAGNESIUM OXIDE 400 (241.3 MG) MG PO TABS: 400.0000 mg | ORAL_TABLET | Freq: Every day | ORAL | 0 refills | Status: DC

## 2018-02-07 MED ORDER — POLYSACCHARIDE IRON COMPLEX 150 MG PO CAPS: 150.0000 mg | ORAL_CAPSULE | Freq: Two times a day (BID) | ORAL | 0 refills | Status: DC

## 2018-02-07 MED ORDER — HYDROCHLOROTHIAZIDE 12.5 MG PO CAPS: 12.5000 mg | ORAL_CAPSULE | Freq: Every day | ORAL | 0 refills | Status: DC

## 2018-02-07 MED ORDER — HYDROCHLOROTHIAZIDE 12.5 MG PO CAPS
12.5000 mg | ORAL_CAPSULE | Freq: Every day | ORAL | Status: DC
  Administered 2018-02-07: 12.5 mg via ORAL
  Filled 2018-02-07 (×2): qty 1

## 2018-02-07 MED ORDER — LABETALOL HCL 200 MG PO TABS: 400.0000 mg | ORAL_TABLET | Freq: Two times a day (BID) | ORAL | 0 refills | Status: DC

## 2018-02-07 NOTE — Discharge Summary (Signed)
Obstetric Discharge Summary Reason for Admission: rupture of membranes and breech presentation Prenatal Procedures: none Intrapartum Procedures: cesarean: low cervical, transverse Postpartum Procedures: none Complications-Operative and Postpartum: uncontrolled chronic hypertension Hemoglobin  Date Value Ref Range Status  02/06/2018 7.6 (L) 12.0 - 15.0 g/dL Final   HCT  Date Value Ref Range Status  02/06/2018 24.2 (L) 36.0 - 46.0 % Final    Physical Exam:  General: alert, cooperative and no distress Lochia: appropriate Uterine Fundus: firm Incision: healing well, no significant drainage, no dehiscence, no significant erythema DVT Evaluation: No evidence of DVT seen on physical exam.  Discharge Diagnoses: Term Pregnancy-delivered  Discharge Information: Date: 02/07/2018 Diet: routine Medications: Ibuprofen, Iron and HCTZ Condition: stable Instructions: refer to practice specific booklet Discharge to: home Follow-up Information    Olivia Mackieaavon, Richard, MD. Schedule an appointment as soon as possible for a visit on 02/10/2018.   Specialty:  Obstetrics and Gynecology Contact information: [address] [place] [address] [phone]           Newborn Data: Live born female  Birth Weight: 8 lb 2.7 oz (3705 g) APGAR: 10, 10  Newborn Delivery   Birth date/time:  02/04/2018 03:00:00 Delivery type:  C-Section, Low Transverse C-section categorization:  Primary     Home with mother.  Ceasar MonsSarah Fallon Haecker 02/07/2018, 5:06 PM

## 2018-02-07 NOTE — Lactation Note (Signed)
This note was copied from a baby's chart. Lactation Consultation Note  Patient Name: Morgan Colon ZOXWR'UToday's Date: 02/07/2018 Reason for consult: Follow-up assessment;Infant weight loss;Early term 37-38.6wks(Encouraged mom to call for feeding assessment)  Infant being held by father and sleeping after a 60 ml feed of Sim 19 at 0500.  Infant is now 3978 hours old and has an 11% weight loss.  Mom has been pumping and feeding breast milk with formula supplementation.  Mother states that infant does not latch well.  LC offered to watch latch with the next feed and mother is receptive to this idea.  She will call when baby shows feeding cues.    Engorgement prevention and treatment discussed with mother. She has a DEBP at home.    Maternal Data Has patient been taught Hand Expression?: Yes Does the patient have breastfeeding experience prior to this delivery?: Yes  Feeding    LATCH Score                   Interventions Interventions: Hand express;DEBP  Lactation Tools Discussed/Used     Consult Status Consult Status: Follow-up Date: 02/07/18 Follow-up type: In-patient    [name] R Aquiles Ruffini 02/07/2018, 9:10 AM

## 2018-02-07 NOTE — Progress Notes (Signed)
  Wants to go home - upset about being in hospital and not getting any sleep  Intermittent chest pain and SOB  - denies now (but wants discharge now)  uncontrolled hypertension despite medication adjustment /Hx poor BP control in past and throughout pregnancy  "its always high and it's high now - why can't I go home"  Weight same ~ 120kg BP: 152/87 - 147/97 - 153/86 - 139/88 - 142/88 - 152/92  - labetalol dose double yesterday PM to 400mg  BID HCTZ initial 25mg  dose and 12.5mg  started today Net (+) 810 today and net (+ ~3 liters this admission)  Received IV iron / declined transfusion for severe anemia (hgb 7.5 nadir)  Consult Dr Billy Coastaavon  - ok for DC home with follow-up in office Friday for BP recheck  Marlinda Mikeanya Deanda Ruddell CNM [facility]

## 2018-02-07 NOTE — Progress Notes (Signed)
S:  Reports feeling well overall.  Some c/o of incisional pain well controlled with ibuprofen and occasional percocet.  Tolerating regular diet without N/V.  Up ad lib/ambulating.  Denies HA, vision changes, epigastric/RUQ pain.  Denies dizziness.  Reports some SOB.  Voiding without difficulty.  Passing flatus.  Had a BM this morning.  Reports bleeding as light, without clots.  Continuing to work on breastfeeding, specifically latch.  Has been pumping, feeding EBL, and supplementing with formula due to weight loss of infant.  Unsure about plans for contraception.  Feels that family support is adequate.  Desires discharge home today.  Concerned that lack of sleep and added stress are contributing to increased BP.  O: Vital signs BP (!) 153/86 (BP Location: Left Arm)   Pulse 80   Temp 98.4 F (36.9 C) (Oral)   Resp 17   Ht 5\' 4"  (1.626 m)   Wt 120.1 kg (264 lb 12 oz)   SpO2 96%   Breastfeeding? Unknown   BMI 45.44 kg/m    BP  Over last 24-36 hours 142-161 systolic; 82-97 diastolic  Labs   Recent Labs    02/05/18 0551 02/06/18 0800  WBC 15.0* 13.3*  HGB 7.5* 7.6*  PLT 276 304    General:  A&O x 3, cooperative, NAD. Skin:  Warm, dry, appropriate for ethnicity HEENT:  Symmetrical, no lumps noted.  Sclera white. No lesions, discharge, or swelling noted. Heart:  RRR, no murmurs. Lungs:  Breathing even and unlabored,  CTA bilaterally. Breasts:  Symmetric, no lesions, masses, or tenderness.  No discharge. Abdomen:  Soft, non-tender, non-distended.  Bowel sounds present all quadrants.  Some silver striae noted. Musculoskeletal:  Full ROM of neck, arm, and legs. Peripheral:  Radial pulses equal bilaterally.  No varicosities noted.  +1 edema noted. C/S incision:  Warm, dry, well approximated, no s/s of infection noted.  Honeycomb dressing in place.  Fundus: firm, non-tender, U-2 Lochia light, without clots. Perineum:  Intact, no edema. Marland Kitchen.  A: PP Post-op day 3, s/p primary c/s for  malpresentation Chronic HTN, uncontrolled ID anemia, compounded by ABL anemia Breastfeeding, supplementing with formula Bonding well with infant.   P:   Discharge home. Return to clinic for BP check on Friday Continue to monitor BP at home and call for increased swelling, HA, vision changes, epigastric/RUQ pain, SOB Return to clinic for PP visit at 6 weeks Continue Niferex and Magnesium oxide supplements Start HCTZ 12.5 mg daily Teaching/education given on breast care for bottle/breastfeeding, perineal care, diet, exercise, resuming intercourse, and incision care.  Discussed  S/s of infection, s/s of pre-eclampsia, and when to call office.  Ceasar MonsSarah Kashius Dominic, RN, SNM 02/07/2018 , 10:48 AM

## 2018-02-07 NOTE — Progress Notes (Signed)
   02/07/18 1051  Vital Signs  BP (!) 147/97  BP Location Left Arm  Patient Position (if appropriate) Semi-fowlers  BP Method Automatic  Pulse Rate 86  Resp 17  Oxygen Therapy  SpO2 98 %   Fredric MareBailey, CNM contacted for patient c/o SOB, with right chest tightness and pain under breast. No headache, dizziness, visual disturbances. Vitals & Assessment reported and documented. Fredric MareBailey asks for continuous pulse ox for a couple hours and to report any repeat episodes of SOB. Will continue to monitor.

## 2019-01-28 IMAGING — US US MFM FETAL BPP W/O NON-STRESS
1 series · 15 of 22 positions shown · non-contrast
Comparison: none

[Series 1: us mfm fetal bpp w/o non-stress · 22 acquisitions, 15 frames shown]
[im 1/22]
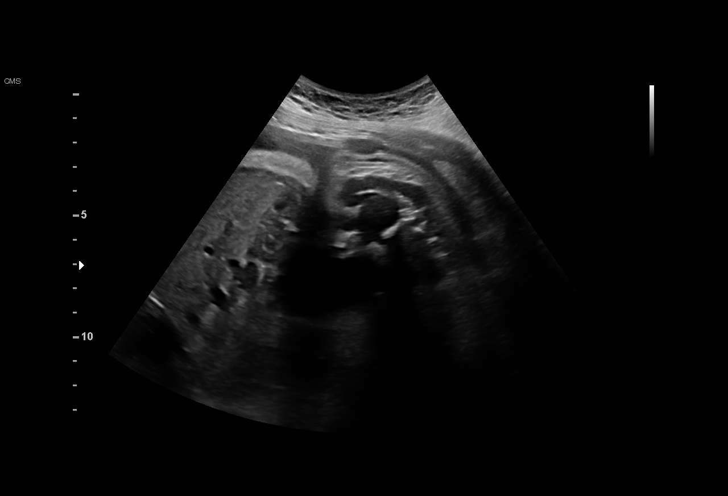
[im 3/22]
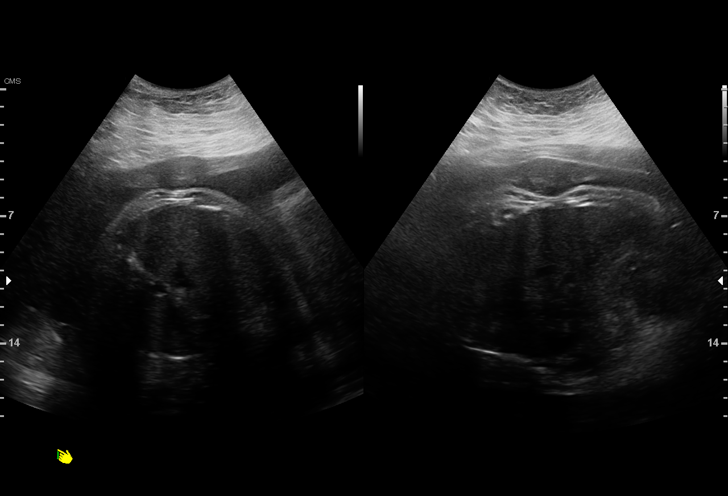
[im 4/22]
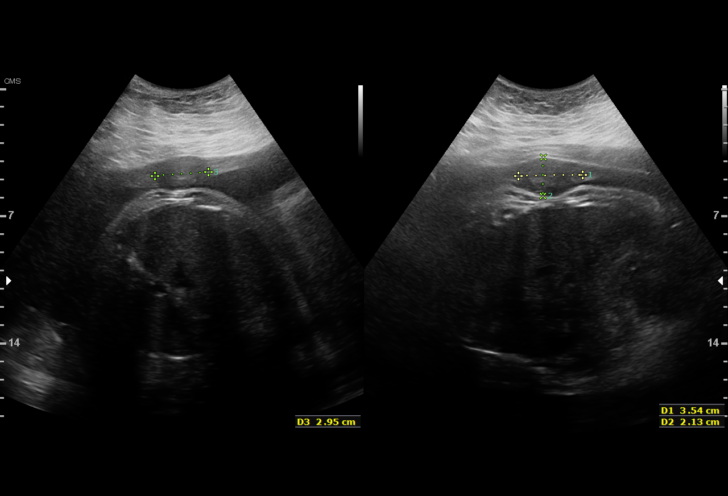
[im 6/22]
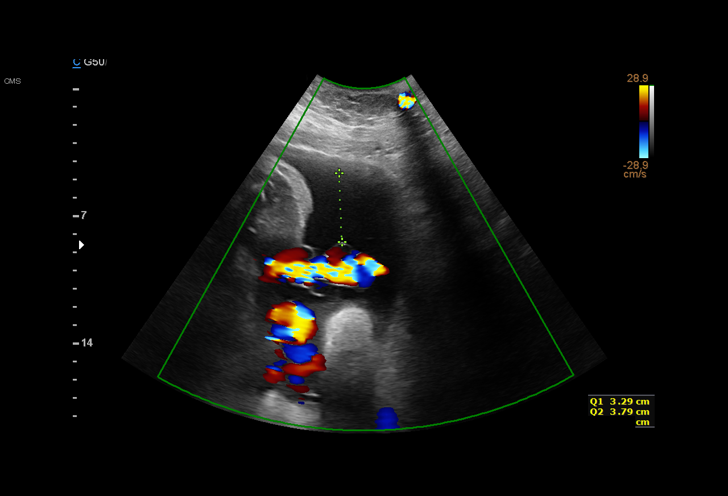
[im 7/22]
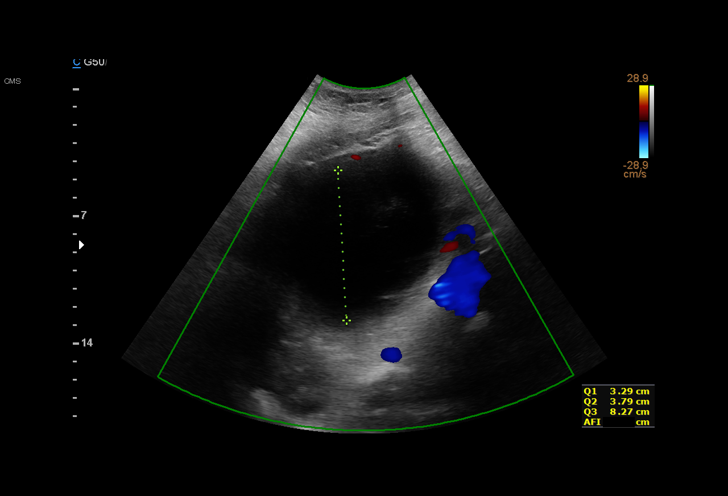
[im 9/22]
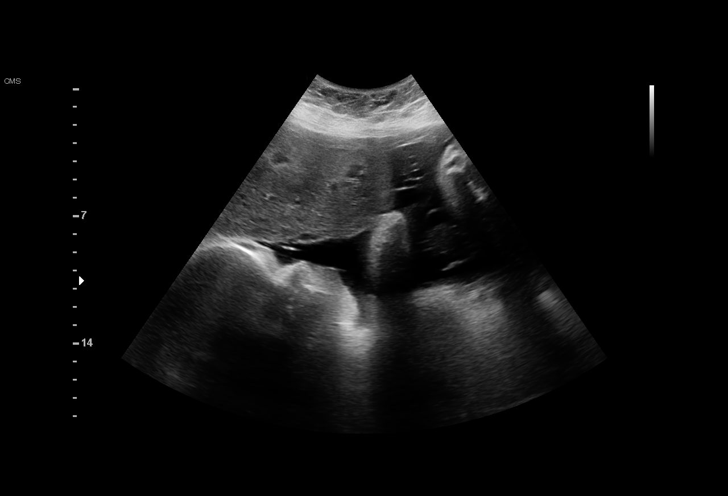
[im 10/22]
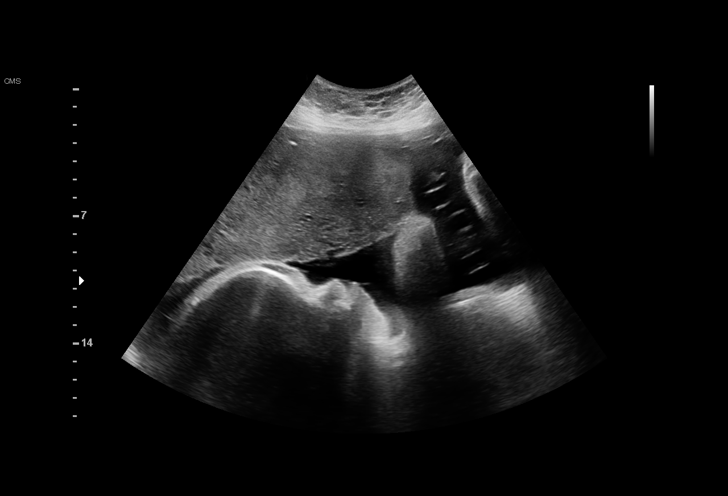
[im 12/22]
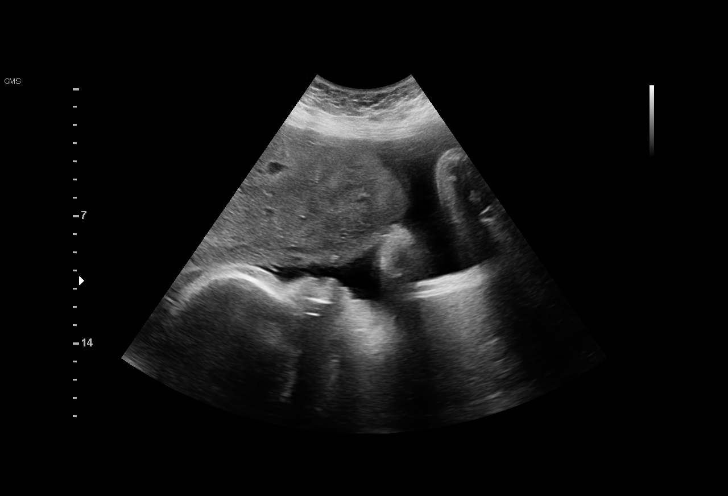
[im 13/22]
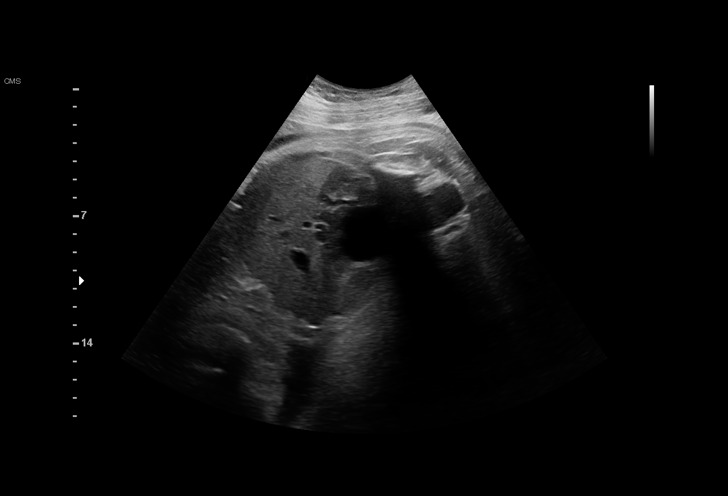
[im 14/22]
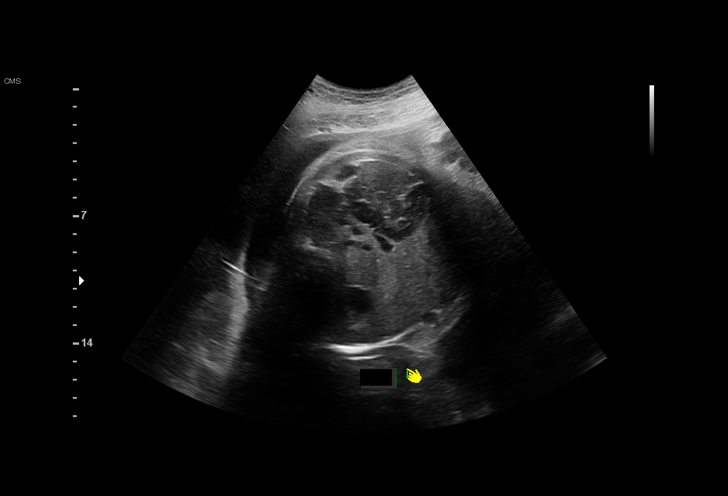
[im 16/22]
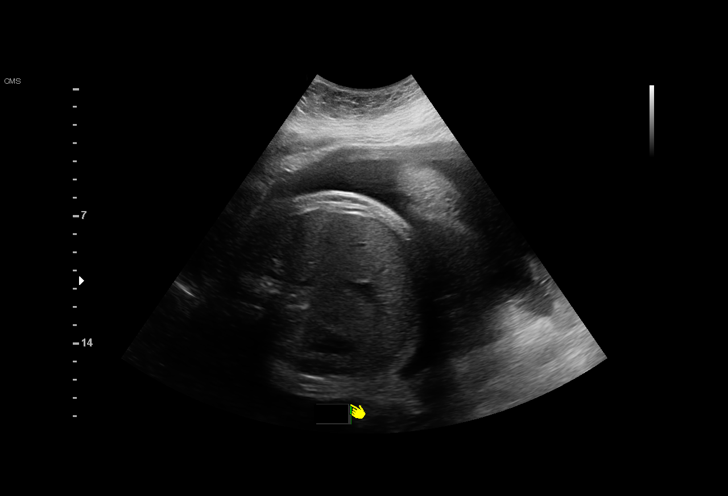
[im 17/22]
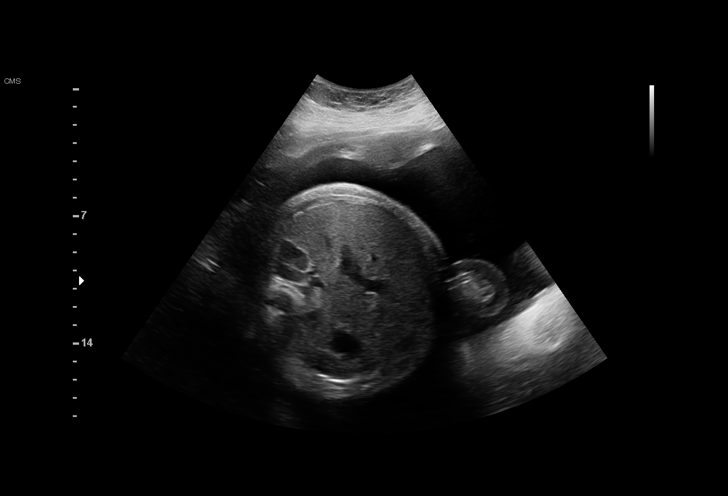
[im 19/22]
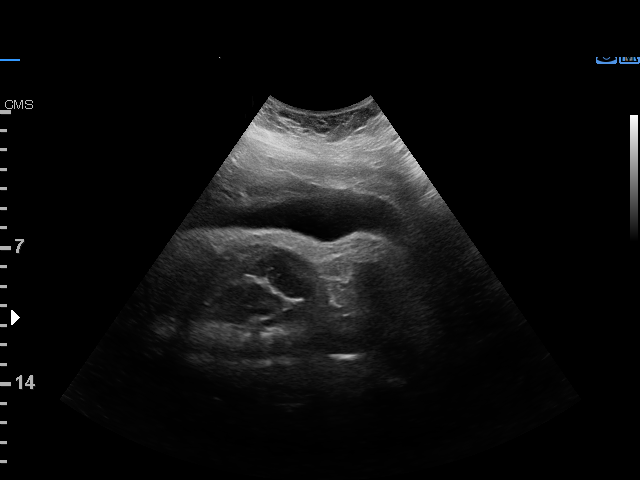
[im 20/22]
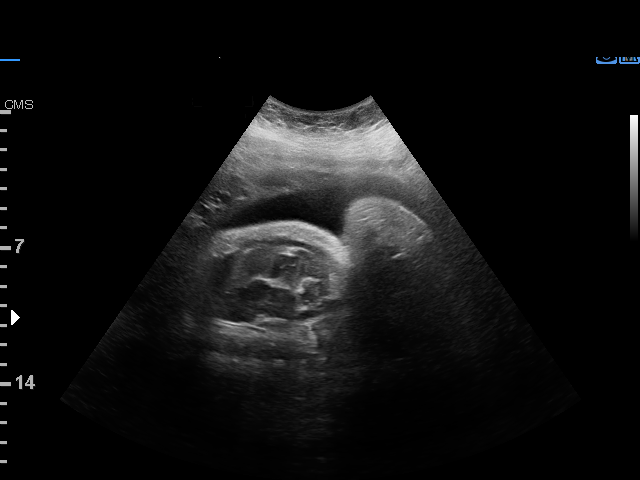
[im 22/22]
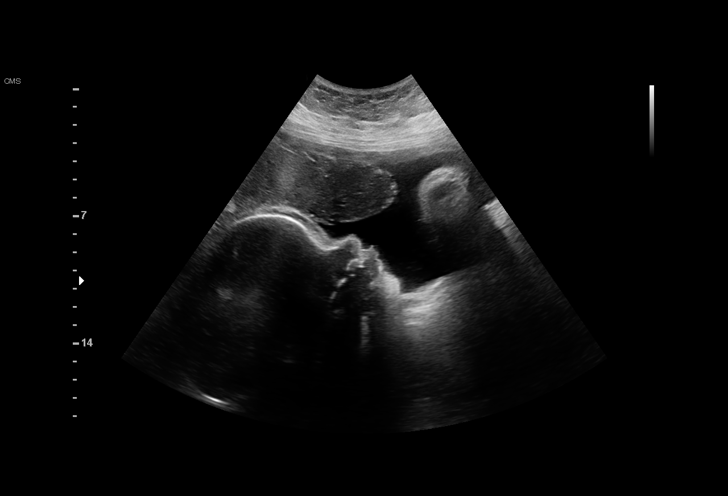

[15 of 22 positions shown; findings below may reference images not displayed]

1  AMAZIGH               [id number]      [id number]     [id number]
MARBEL
Indications

36 weeks gestation of pregnancy
Hypertension - Gestational
Obesity complicating pregnancy, third
trimester
Non-reactive NST
OB History

Blood Type:            Height:         Weight (lb):  259       BMI:
Gravidity:    2         Term:   1
Living:       1
Fetal Evaluation

Num Of Fetuses:     1
Fetal Heart         143
Rate(bpm):
Cardiac Activity:   Observed
Presentation:       Breech

Amniotic Fluid
AFI FV:      Polyhydramnios

AFI Sum(cm)     %Tile       Largest Pocket(cm)
22.42           85

RUQ(cm)       RLQ(cm)       LUQ(cm)        LLQ(cm)
3.29
Biophysical Evaluation

Amniotic F.V:   Within normal limits       F. Tone:        Observed
F. Movement:    Observed                   Score:          [DATE]
F. Breathing:   Observed
Gestational Age

Best:          36w 2d     Det. By:  Previous Ultrasound      EDD:   02/21/18
Impression

Single living intrauterine pregnancy at 36w 2d.
Breech presentation.
Mild polyhydramnios with MVP 8.27cm.
BPP [DATE].
Recommendations

Continue clinical evaluation and management.
Recommend delivery by 37 weeks if maternal and fetal status
remain reassuring (gestational hypertension).

## 2020-01-30 ENCOUNTER — Emergency Department (HOSPITAL_COMMUNITY)
Admission: EM | Admit: 2020-01-30 | Discharge: 2020-01-31 | Disposition: A | Discharge status: Home / Self Care | Payer: BC Managed Care – PPO | Attending: Emergency Medicine | Admitting: Emergency Medicine

## 2020-01-30 ENCOUNTER — Other Ambulatory Visit: Payer: Self-pay

## 2020-01-30 DIAGNOSIS — Z79899 Other long term (current) drug therapy: Secondary | ICD-10-CM | POA: Diagnosis not present

## 2020-01-30 DIAGNOSIS — R42 Dizziness and giddiness: Secondary | ICD-10-CM | POA: Insufficient documentation

## 2020-01-30 LAB — CBC
HCT: 36 % (ref 36.0–46.0)
Hemoglobin: 11 g/dL — ABNORMAL LOW (ref 12.0–15.0)
MCH: 25.9 pg — ABNORMAL LOW (ref 26.0–34.0)
MCHC: 30.6 g/dL (ref 30.0–36.0)
MCV: 84.7 fL (ref 80.0–100.0)
Platelets: 428 10*3/uL — ABNORMAL HIGH (ref 150–400)
RBC: 4.25 MIL/uL (ref 3.87–5.11)
RDW: 13.2 % (ref 11.5–15.5)
WBC: 15.9 10*3/uL — ABNORMAL HIGH (ref 4.0–10.5)
nRBC: 0 % (ref 0.0–0.2)

## 2020-01-30 LAB — BASIC METABOLIC PANEL
Anion gap: 10 (ref 5–15)
BUN: 10 mg/dL (ref 6–20)
CO2: 26 mmol/L (ref 22–32)
Calcium: 8.8 mg/dL — ABNORMAL LOW (ref 8.9–10.3)
Chloride: 102 mmol/L (ref 98–111)
Creatinine, Ser: 0.64 mg/dL (ref 0.44–1.00)
GFR calc Af Amer: >60 mL/min (ref >60)
GFR calc non Af Amer: >60 mL/min (ref >60)
Glucose, Bld: 182 mg/dL — ABNORMAL HIGH (ref 70–99)
Potassium: 3.2 mmol/L — ABNORMAL LOW (ref 3.5–5.1)
Sodium: 138 mmol/L (ref 135–145)

## 2020-01-30 LAB — I-STAT BETA HCG BLOOD, ED (MC, WL, AP ONLY): I-stat hCG, quantitative: <5 m[IU]/mL (ref <5)

## 2020-01-30 MED ORDER — SODIUM CHLORIDE 0.9% FLUSH: 3.0000 mL | Freq: Once | INTRAVENOUS | Status: DC

## 2020-01-30 NOTE — ED Triage Notes (Signed)
Pt bib gcems from home w/ c/o sudden onset dizziness w/ n/v. Pt received 4mg  IV zofran w/ EMS. Pt states she has a hx of vertigo.

## 2020-01-31 ENCOUNTER — Emergency Department (HOSPITAL_COMMUNITY): Payer: BC Managed Care – PPO

## 2020-01-31 LAB — URINALYSIS, ROUTINE W REFLEX MICROSCOPIC
Bilirubin Urine: NEGATIVE
Glucose, UA: 50 mg/dL — AB
Hgb urine dipstick: NEGATIVE
Ketones, ur: NEGATIVE mg/dL
Leukocytes,Ua: NEGATIVE
Nitrite: NEGATIVE
Protein, ur: 30 mg/dL — AB
Specific Gravity, Urine: 1.019 (ref 1.005–1.030)
pH: 7 (ref 5.0–8.0)

## 2020-01-31 MED ORDER — MECLIZINE HCL 25 MG PO TABS: 25.0000 mg | ORAL_TABLET | Freq: Three times a day (TID) | ORAL | 0 refills | Status: DC | PRN

## 2020-01-31 MED ORDER — MECLIZINE HCL 25 MG PO TABS
25.0000 mg | ORAL_TABLET | Freq: Once | ORAL | Status: AC
  Administered 2020-01-31: 25 mg via ORAL
  Filled 2020-01-31: qty 1

## 2020-01-31 NOTE — ED Provider Notes (Signed)
Emergency Department Provider Note   I have reviewed the triage vital signs and the nursing notes.   HISTORY  Chief Complaint Dizziness   HPI Morgan Colon is a 39 y.o. female who presents the emerge department today with vertigo.  Patient states she has history of the same.  She states that when she laid down tonight she had acute onset of room spinning and then multiple episodes of nonbloody nonbilious vomiting.  EMS was called they took her blood pressure while she was still vomiting and was hypertensive.  They brought her here for further evaluation.  Patient states that this time she feels much better as she is already had some Zofran.  She does have a dry mouth.  She does no longer have significant vertigo.  She states that she does have some left ear fullness over the last few days.  No cough.  No left ear pain.  No sore throat or other associated symptoms.  No paresthesias or weakness anywhere.   No other associated or modifying symptoms.    Past Medical History:  Diagnosis Date  . Anemia associated with acute blood loss compounded with SVD 02/05/2018    Patient Active Problem List   Diagnosis Date Noted  . Acute blood loss anemia 02/06/2018  . Breech birth 02/04/2018  . Cesarean delivery  02/04/2018  . Postpartum care following cesarean delivery (3/23) 02/04/2018    Past Surgical History:  Procedure Laterality Date  . CESAREAN SECTION N/A 02/04/2018   Procedure: CESAREAN SECTION;  Surgeon: Brien Few, MD;  Location: [facility];  Service: Obstetrics;  Laterality: N/A;    Current Outpatient Rx  . Order #: [id number] Class: Normal  . Order #: [id number] Class: Normal  . Order #: [id number] Class: Normal  . Order #: [id number] Class: Normal  . Order #: [id number] Class: Normal  . Order #: [id number] Class: Print  . Order #: [id number] Class: Print  . Order #: [id number] Class: Historical Med    Allergies Patient has no known allergies.  Family History  Problem  Relation Age of Onset  . Hypertension Mother   . Diabetes Mother     Social History Social History   Tobacco Use  . Smoking status: Never Smoker  . Smokeless tobacco: Never Used  Substance Use Topics  . Alcohol use: No  . Drug use: No    Review of Systems  All other systems negative except as documented in the HPI. All pertinent positives and negatives as reviewed in the HPI. ____________________________________________   PHYSICAL EXAM:  VITAL SIGNS: ED Triage Vitals [01/30/20 2240]  Enc Vitals Group     BP (!) 143/104     Pulse Rate 81     Resp 16     Temp (!) 97.4 F (36.3 C)     Temp Source Oral     SpO2 96 %    Constitutional: Alert and oriented. Well appearing and in no acute distress. Eyes: Conjunctivae are normal. PERRL. EOMI. Head: Atraumatic. Nose: No congestion/rhinnorhea. Mouth/Throat: Mucous membranes are moist.  Oropharynx non-erythematous. Neck: No stridor.  No meningeal signs.   Cardiovascular: Normal rate, regular rhythm. Good peripheral circulation. Grossly normal heart sounds.   Respiratory: Normal respiratory effort.  No retractions. Lungs CTAB. Gastrointestinal: Soft and nontender. No distention.  Musculoskeletal: No lower extremity tenderness nor edema. No gross deformities of extremities. Neurologic:  Normal speech and language. No gross focal neurologic deficits are appreciated.  Skin:  Skin is warm, dry and intact. No rash noted.   ____________________________________________  LABS (all labs ordered are listed, but only abnormal results are displayed)  Labs Reviewed  BASIC METABOLIC PANEL - Abnormal; Notable for the following components:      Result Value   Potassium 3.2 (*)    Glucose, Bld 182 (*)    Calcium 8.8 (*)    All other components within normal limits  CBC - Abnormal; Notable for the following components:   WBC 15.9 (*)    Hemoglobin 11.0 (*)    MCH 25.9 (*)    Platelets 428 (*)    All other components within normal  limits  URINALYSIS, ROUTINE W REFLEX MICROSCOPIC - Abnormal; Notable for the following components:   APPearance HAZY (*)    Glucose, UA 50 (*)    Protein, ur 30 (*)    Bacteria, UA RARE (*)    All other components within normal limits  I-STAT BETA HCG BLOOD, ED ([facility], [facility], [facility] ONLY)  CBG MONITORING, ED   ____________________________________________  EKG  RADIOLOGY  CT Head Wo Contrast  Result Date: 01/31/2020 CLINICAL DATA:  Headache, intracranial hemorrhage suspected EXAM: CT HEAD WITHOUT CONTRAST TECHNIQUE: Contiguous axial images were obtained from the base of the skull through the vertex without intravenous contrast. COMPARISON:  None. FINDINGS: Brain: No evidence of acute infarction, hemorrhage, hydrocephalus, extra-axial collection or mass lesion/mass effect. Vascular: No hyperdense vessel or unexpected calcification. Skull: No calvarial fracture or suspicious osseous lesion. No scalp swelling or hematoma. Sinuses/Orbits: Minimal thickening the left ethmoid air cells. Paranasal sinuses and mastoid air cells are otherwise predominantly clear. Included orbital structures are unremarkable. Other: None IMPRESSION: No acute intracranial findings. Electronically Signed   By: Kreg Shropshire M.D.   On: 01/31/2020 04:39   ____________________________________________   PROCEDURES  Procedure(s) performed:   Procedures  ____________________________________________   INITIAL IMPRESSION / ASSESSMENT AND PLAN / ED COURSE  Likely BPPV.  Significantly improved at this point.  Will start meclizine with ENT follow-up if it returns.  Otherwise stable for discharge.  Pertinent labs & imaging results that were available during my care of the patient were reviewed by me and considered in my medical decision making (see chart for details).  A medical screening exam was performed and I feel the patient has had an appropriate workup for their chief complaint at this time and likelihood of emergent  condition existing is low. They have been counseled on decision, discharge, follow up and which symptoms necessitate immediate return to the emergency department. They or their family verbally stated understanding and agreement with plan and discharged in stable condition.   ____________________________________________  FINAL CLINICAL IMPRESSION(S) / ED DIAGNOSES  Final diagnoses:  Vertigo    MEDICATIONS GIVEN DURING THIS VISIT:  Medications  sodium chloride flush (NS) 0.9 % injection 3 mL (has no administration in time range)  meclizine (ANTIVERT) tablet 25 mg (25 mg Oral Given 01/31/20 0407)     NEW OUTPATIENT MEDICATIONS STARTED DURING THIS VISIT:  Discharge Medication List as of 01/31/2020  5:25 AM    START taking these medications   Details  meclizine (ANTIVERT) 25 MG tablet Take 1 tablet (25 mg total) by mouth 3 (three) times daily as needed for dizziness., Starting Thu 01/31/2020, Print        Note:  This note was prepared with assistance of Dragon voice recognition software. Occasional wrong-word or sound-a-like substitutions may have occurred due to the inherent limitations of voice recognition software.   Annlouise Gerety, Barbara Cower, MD 01/31/20 [phone]

## 2020-02-01 ENCOUNTER — Telehealth (HOSPITAL_COMMUNITY): Payer: Self-pay

## 2020-03-10 ENCOUNTER — Ambulatory Visit: Payer: BC Managed Care – PPO | Attending: Otolaryngology | Admitting: Physical Therapy

## 2020-07-16 ENCOUNTER — Ambulatory Visit: Payer: BC Managed Care – PPO | Attending: Otolaryngology | Admitting: Physical Therapy

## 2020-07-16 ENCOUNTER — Encounter: Payer: Self-pay | Admitting: Physical Therapy

## 2020-07-16 ENCOUNTER — Other Ambulatory Visit: Payer: Self-pay

## 2020-07-16 DIAGNOSIS — R42 Dizziness and giddiness: Secondary | ICD-10-CM | POA: Insufficient documentation

## 2020-07-16 DIAGNOSIS — R2681 Unsteadiness on feet: Secondary | ICD-10-CM | POA: Insufficient documentation

## 2020-07-16 NOTE — Therapy (Signed)
[facility] [address] [place], [place], [address] Phone: [phone]   Fax:  [phone]  Physical Therapy Evaluation  Patient Details  Name: JENNAYA POGUE MRN: [id number] Date of Birth: [birth year] Referring Provider (PT): Misty Stanley, MD   Encounter Date: 07/16/2020   PT End of Session - 07/16/20 0816    Visit Number 1    Number of Visits 7    Date for PT Re-Evaluation 09/14/20    Authorization Type BCBS    PT Start Time 0718    PT Stop Time 0805    PT Time Calculation (min) 47 min    Activity Tolerance Patient tolerated treatment well    Behavior During Therapy [facility] for tasks assessed/performed           Past Medical History:  Diagnosis Date  . Anemia associated with acute blood loss compounded with SVD 02/05/2018    Past Surgical History:  Procedure Laterality Date  . CESAREAN SECTION N/A 02/04/2018   Procedure: CESAREAN SECTION;  Surgeon: Olivia Mackie, MD;  Location: [facility];  Service: Obstetrics;  Laterality: N/A;    There were no vitals filed for this visit.    Subjective Assessment - 07/16/20 0722    Subjective Started in March, woke up with vertigo, nausea, diaphoresis.  Symptoms resolved and then Monday symptoms returned.  First trip to ED imaging was negative for acute changes.  Went to ENT where they performed an Epley manuever with no significant findings.  Did not have an audiology exam.  Still issues with fullness in L ear and decreased hearing; pt reports symptoms resolve after she vomits.    Pertinent History Cesarean section due to breech birth, acute blood loss anemia, gestational diabetes    Diagnostic tests no acute findings    Patient Stated Goals to know what is going on and then fix it so it isn't debilitating    Currently in Pain? No/denies              [facility] PT Assessment - 07/16/20 0321      Assessment   Medical Diagnosis Vertigo    Referring  Provider (PT) Misty Stanley, MD    Onset Date/Surgical Date 01/30/20    Next MD Visit PCP today    Prior Therapy no      Precautions   Precautions Other (comment)    Precaution Comments Cesarean section due to breech birth, acute blood loss anemia, gestational diabetes      Balance Screen   Has the patient fallen in the past 6 months No      Home Environment   Living Environment Private residence    Additional Comments when having an episode of dizziness she isn't able to perform any daily activities and unable to work      Prior Function   Level of Independence Independent    Vocation Full time employment    Buyer, retail, 1st grade      Cognition   Overall Cognitive Status Within Functional Limits for tasks assessed      Observation/Other Assessments   Focus on Therapeutic Outcomes (FOTO)  34%    Other Surveys  Dizziness Handicap Inventory (DHI)    Dizziness Handicap Inventory (DHI)  28 mild      Sensation   Light Touch Appears Intact      ROM / Strength   AROM / PROM / Strength Strength      Strength   Overall Strength  Within functional limits for tasks performed                  Vestibular Assessment - 07/16/20 0732      Vestibular Assessment   General Observation No dizziness at baseline today      Symptom Behavior   Subjective history of current problem attacks are preceeded by aural fullness and decreased hearing in L ear; pt reports having tinnitus and vomiting with episodes.  Denies changes in vision, HA or neck pain.  Pt does report sensitivity to noise with attacks but no sensitivity to light or smells.      Type of Dizziness  Spinning    Frequency of Dizziness intermittent    Duration of Dizziness 30-40 minutes > a couple hours    Symptom Nature Spontaneous    Aggravating Factors Spontaneous onset    Relieving Factors Rest    Progression of Symptoms Better    History of similar episodes in 2019 had a bad cold that  affected L ear      Oculomotor Exam   Oculomotor Alignment Normal    Ocular ROM WFL    Spontaneous Absent    Gaze-induced  Absent    Smooth Pursuits Intact    Saccades Intact    Comment Convergence intact      Oculomotor Exam-Fixation Suppressed    Left Head Impulse positive for one refixation saccade    Right Head Impulse negative      Vestibulo-Ocular Reflex   VOR to Slow Head Movement Normal    VOR Cancellation Normal      Visual Acuity   Static 10    Dynamic 7   3 line difference     Positional Testing   Dix-Hallpike Dix-Hallpike Right;Dix-Hallpike Left    Horizontal Canal Testing Horizontal Canal Right;Horizontal Canal Left      Dix-Hallpike Right   Dix-Hallpike Right Duration 0    Dix-Hallpike Right Symptoms No nystagmus      Dix-Hallpike Left   Dix-Hallpike Left Duration 0    Dix-Hallpike Left Symptoms No nystagmus      Horizontal Canal Right   Horizontal Canal Right Duration 0    Horizontal Canal Right Symptoms Normal      Horizontal Canal Left   Horizontal Canal Left Duration 0    Horizontal Canal Left Symptoms Normal      Positional Sensitivities   Sit to Supine No dizziness    Supine to Left Side No dizziness    Supine to Right Side No dizziness    Supine to Sitting No dizziness    Right Hallpike No dizziness    Up from Right Hallpike No dizziness    Up from Left Hallpike No dizziness    Nose to Right Knee No dizziness    Right Knee to Sitting No dizziness    Nose to Left Knee No dizziness    Left Knee to Sitting No dizziness    Head Turning x 5 No dizziness    Head Nodding x 5 No dizziness    Pivot Right in Standing No dizziness    Pivot Left in Standing No dizziness    Rolling Right No dizziness    Rolling Left No dizziness              Objective measurements completed on examination: See above findings.               PT Education - 07/16/20 4818    Education Details clinical findings, PT  POC and goals, differential  diagnosis vestibular hypofunction due to labyrinthitis vs. meniere's disease vs. vestibular migraines    Person(s) Educated Patient    Methods Explanation    Comprehension Verbalized understanding               PT Long Term Goals - 07/16/20 0829      PT LONG TERM GOAL #1   Title Pt will report increase in overall function to >/= 69% and decrease in DHI by 10 points    Baseline 34%; DHI: 28 mild    Time 6    Period Weeks    Status New    Target Date 08/30/20      PT LONG TERM GOAL #2   Title Pt will demonstrate improved use of VOR as indicated by 2 line difference on DVA    Baseline 3 line difference (10, 7)    Time 6    Period Weeks    Status New    Target Date 08/30/20      PT LONG TERM GOAL #3   Title Pt will be independent with vestibular HEP    Time 6    Period Weeks    Status New    Target Date 08/30/20                  Plan - 07/16/20 0817    Clinical Impression Statement Pt is a 39 year old female referred to Neuro OPPT for evaluation of vertigo.  Pt's PMH is significant for the following: Cesarean section due to breech birth, anemia, gestational diabetes and elevated BP. The following deficits were noted during pt's exam: impaired VOR as indicated by + L HIT and 3 line difference on DVA; pt was negative for nystagmus or vertigo with positional testing.  Pt demonstrates very little residual symptoms after an attack of vertigo but during an attack she is unable to perform any daily activities.  Pt would benefit from skilled PT to address these impairments and functional limitations to maximize functional mobility independence and reduce falls risk.    Personal Factors and Comorbidities Comorbidity 1;Profession    Comorbidities gestational diabetes, elevated BP    Examination-Activity Limitations [place];Locomotion Level;Stand    Examination-Participation Restrictions Community Activity;Driving;Occupation;Meal Prep    Stability/Clinical Decision Making  Stable/Uncomplicated    Clinical Decision Making Low    Rehab Potential Good    PT Frequency 1x / week    PT Duration 8 weeks    PT Treatment/Interventions ADLs/Self Care Home Management;Functional mobility training;Therapeutic activities;Therapeutic exercise;Balance training;Neuromuscular re-education;Patient/family education;Vestibular    [facility]  Did she get a referral to Dr. Suszanne Connerseoh?  Any further attacks?  Teach x1 viewing and corner balance.    Recommended Other Services second opinion from ENT/hearing test    Consulted and Agree with Plan of Care Patient           Patient will benefit from skilled therapeutic intervention in order to improve the following deficits and impairments:  Dizziness  Visit Diagnosis: Dizziness and giddiness  Unsteadiness on feet     Problem List Patient Active Problem List   Diagnosis Date Noted  . Acute blood loss anemia 02/06/2018  . Breech birth 02/04/2018  . Cesarean delivery  02/04/2018  . Postpartum care following cesarean delivery (3/23) 02/04/2018    Dierdre HighmanAudra F Keonia Pasko, PT, DPT 07/16/20    8:32 AM    [facility] [address]  [place], [place], [address] Phone: [phone]   Fax:  [phone]  Name: TESNEEM DUFRANE MRN: [id number] Date of Birth: August 12, 1981

## 2020-07-31 ENCOUNTER — Ambulatory Visit: Payer: BC Managed Care – PPO

## 2020-12-02 ENCOUNTER — Encounter: Payer: Self-pay | Admitting: Physical Therapy

## 2020-12-02 NOTE — Therapy (Signed)
[facility] [address] [place], [place], [address] Phone: [phone]   Fax:  [phone]  Patient Details  Name: Morgan Colon MRN: [id number] Date of Birth: 1981/10/18 Referring Provider:  Gavin Pound, MD  Encounter Date: 12/02/2020  PHYSICAL THERAPY DISCHARGE SUMMARY  Visits from Start of Care: 1  Current functional level related to goals / functional outcomes: Unable to assess; pt did not return after initial evaluation.   Remaining deficits: Dizziness   Education / Equipment: N/A  Plan: Patient agrees to discharge.  Patient goals were not met. Patient is being discharged due to not returning since the last visit.  ?????     Rico Junker, PT, DPT 12/02/20    11:05 AM    [facility] [address] [place], [place], [address] Phone: [phone]   Fax:  [phone]

## 2021-02-01 IMAGING — CT CT HEAD W/O CM
4 series · 17 of 47 positions shown, 19 images · non-contrast
Comparison: None.

CLINICAL DATA: Headache, intracranial hemorrhage suspected

EXAM:
CT HEAD WITHOUT CONTRAST
TECHNIQUE: Contiguous axial images were obtained from the base of the skull
through the vertex without intravenous contrast.

[Series 3: head wo · axial · 0.43mm/px · z∈[-119,-4]mm · 7 of 31 slices shown, 9 images]
[im 4/31  brain]
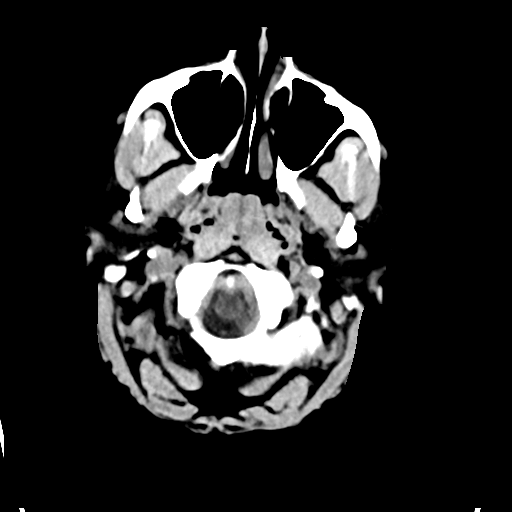
[im 4/31  bone]
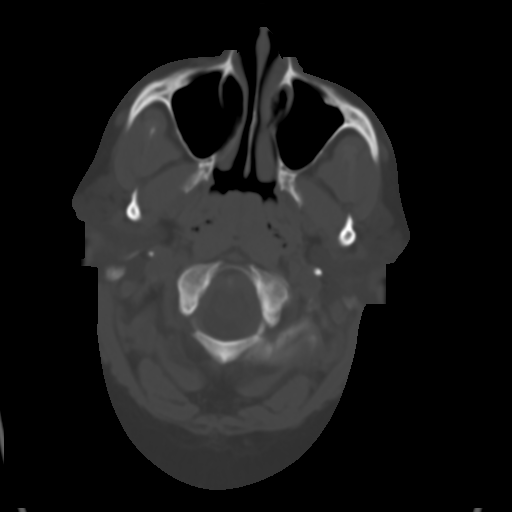
[im 8/31  brain]
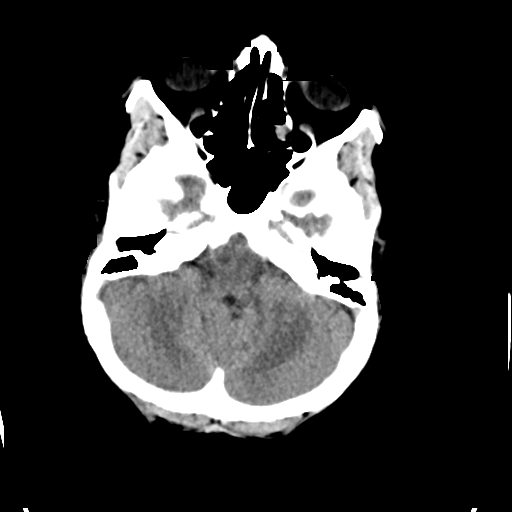
[im 12/31  brain]
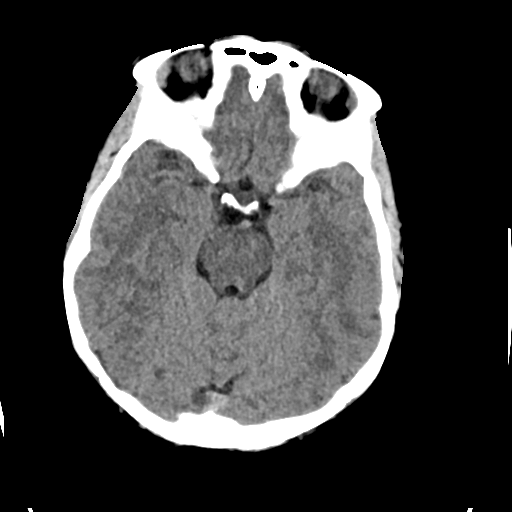
[im 16/31  brain]
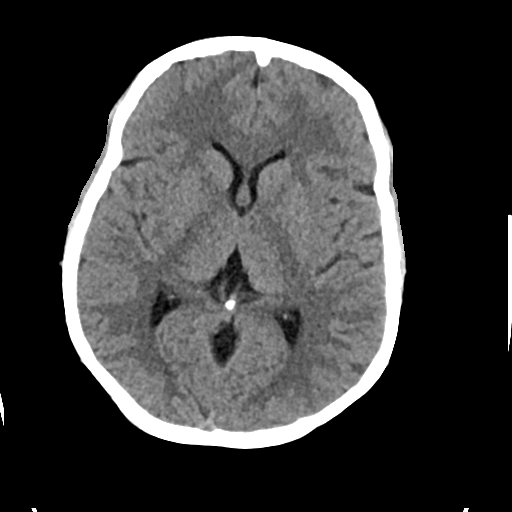
[im 19/31  brain]
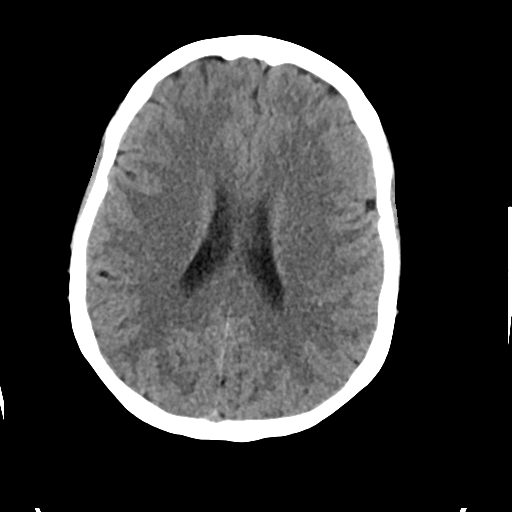
[im 19/31  bone]
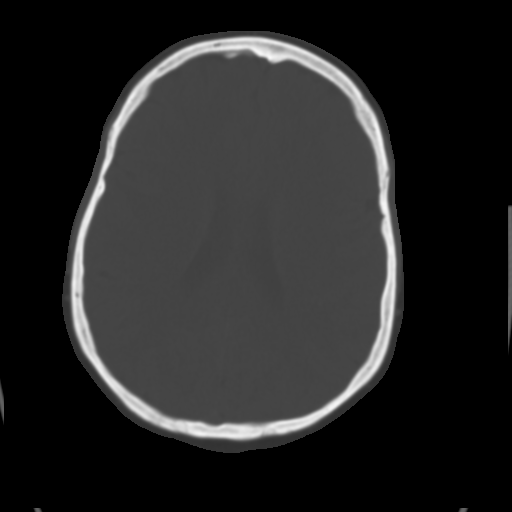
[im 23/31  brain]
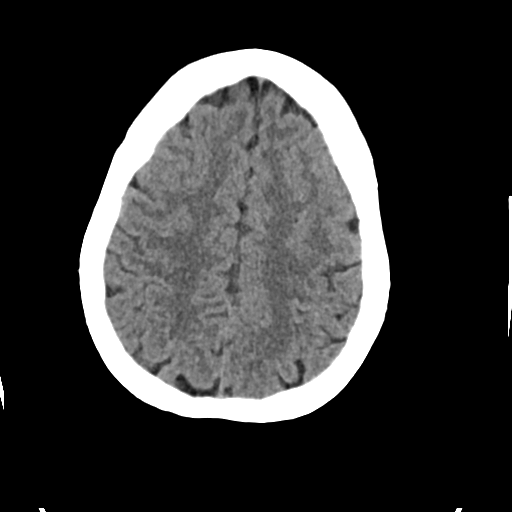
[im 27/31  brain]
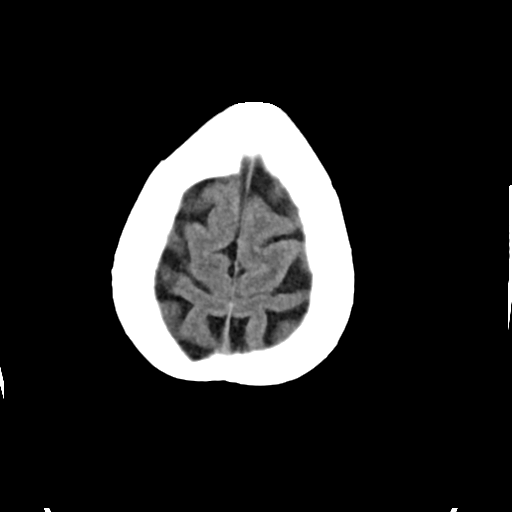

[Series 4: head bone · axial · 0.43mm/px · z∈[-120,-68]mm · 4 of 76 slices shown]
[im 8/76  bone]
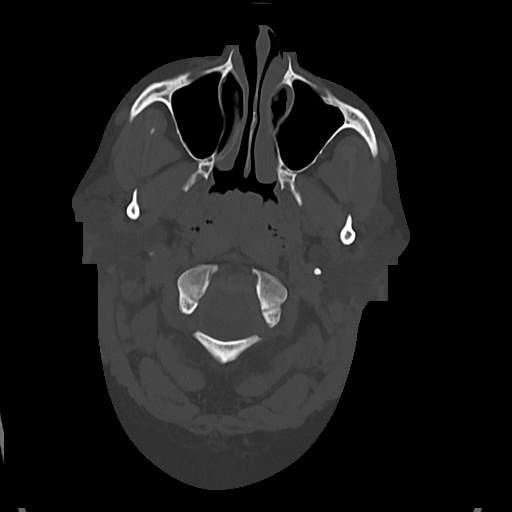
[im 16/76  bone]
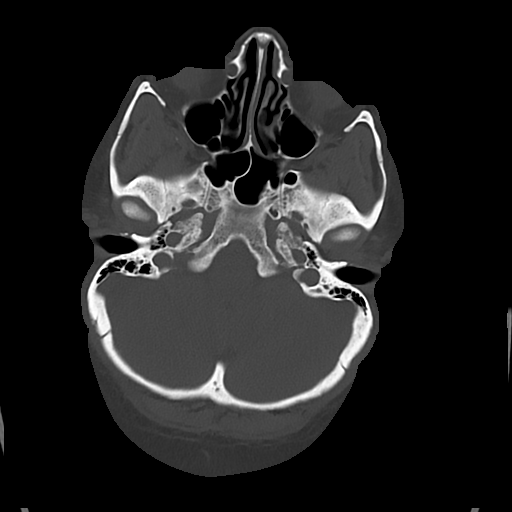
[im 23/76  bone]
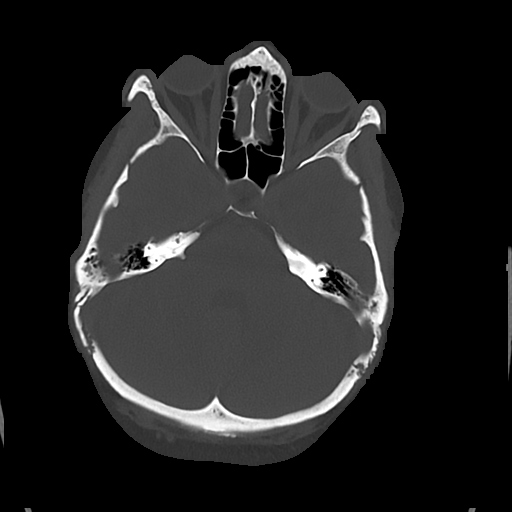
[im 34/76  bone]
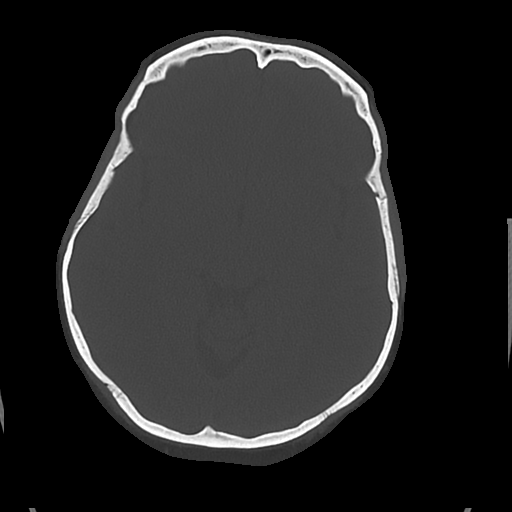

[Series 5: cor soft · coronal · 0.32mm/px · 3 of 73 slices shown]
[im 25/73  brain]
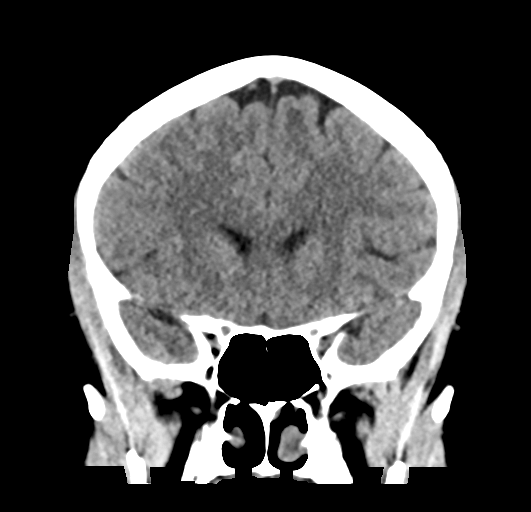
[im 33/73  brain]
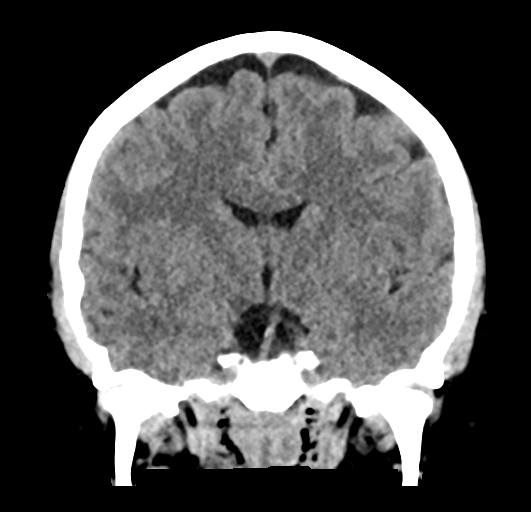
[im 41/73  brain]
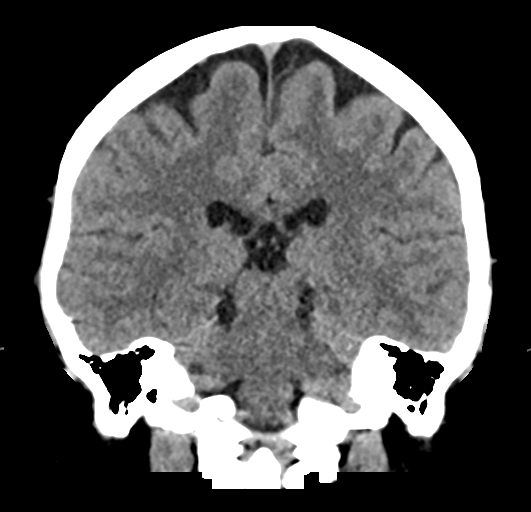

[Series 6: sag soft · sagittal · 0.33mm/px · 3 of 58 slices shown]
[im 20/58  brain]
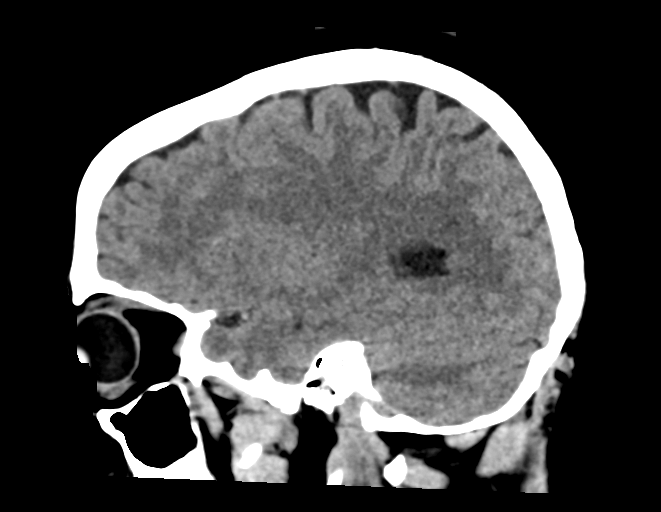
[im 29/58  brain]
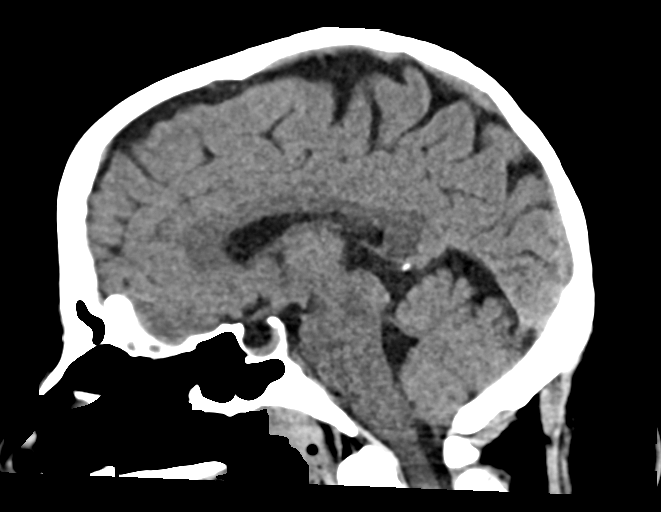
[im 39/58  brain]
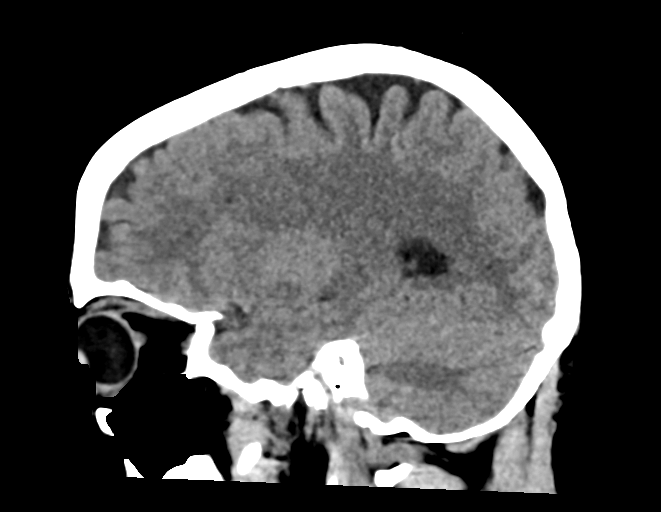

[17 of 47 positions shown; findings below may reference images not displayed]

FINDINGS: Brain: No evidence of acute infarction, hemorrhage, hydrocephalus,
extra-axial collection or mass lesion/mass effect.

Vascular: No hyperdense vessel or unexpected calcification.

Skull: No calvarial fracture or suspicious osseous lesion. No scalp
swelling or hematoma.

Sinuses/Orbits: Minimal thickening the left ethmoid air cells.
Paranasal sinuses and mastoid air cells are otherwise predominantly
clear. Included orbital structures are unremarkable.

Other: None
IMPRESSION: No acute intracranial findings.

## 2021-08-18 ENCOUNTER — Ambulatory Visit (HOSPITAL_COMMUNITY)
Admission: EM | Admit: 2021-08-18 | Discharge: 2021-08-18 | Disposition: A | Discharge status: Home / Self Care | Payer: BC Managed Care – PPO | Attending: Physician Assistant | Admitting: Physician Assistant

## 2021-08-18 ENCOUNTER — Encounter (HOSPITAL_COMMUNITY): Payer: Self-pay | Admitting: Emergency Medicine

## 2021-08-18 ENCOUNTER — Other Ambulatory Visit: Payer: Self-pay

## 2021-08-18 DIAGNOSIS — H9191 Unspecified hearing loss, right ear: Secondary | ICD-10-CM | POA: Diagnosis not present

## 2021-08-18 DIAGNOSIS — I10 Essential (primary) hypertension: Secondary | ICD-10-CM | POA: Diagnosis not present

## 2021-08-18 DIAGNOSIS — H938X1 Other specified disorders of right ear: Secondary | ICD-10-CM

## 2021-08-18 MED ORDER — FLUTICASONE PROPIONATE 50 MCG/ACT NA SUSP: 1.0000 | Freq: Every day | NASAL | 0 refills | Status: DC

## 2021-08-18 MED ORDER — LEVOCETIRIZINE DIHYDROCHLORIDE 5 MG PO TABS: 5.0000 mg | ORAL_TABLET | Freq: Every evening | ORAL | 0 refills | Status: DC

## 2021-08-18 NOTE — ED Triage Notes (Signed)
Pt is present today with ear fullness, nasal congestion, chest congestion, and HA. Pt states sx started last Saturday

## 2021-08-18 NOTE — Discharge Instructions (Signed)
We are starting you on allergy medication to see if this will help your symptoms.  Please take Flonase and Xyzal as prescribed.  I do recommend he follow-up with ENT if your symptoms or not improving.  If you have any worsening symptoms please return for reevaluation.  Your blood pressure is very elevated today.  Please go home and take your blood pressure medication as we discussed.  Monitor your blood pressure and if this remains elevated you need to be reevaluated.  If you develop any chest pain, shortness of breath, headache, vision changes in the setting of high blood pressure you need to go to the emergency room.  Avoid decongestants, caffeine, sodium.  Follow-up with our clinic or primary care within a few days to ensure normalization of blood pressure.

## 2021-08-18 NOTE — ED Provider Notes (Signed)
[facility]    CSN: [id number] Arrival date & time: 08/18/21  1135      History   Chief Complaint Chief Complaint  Patient presents with   Otalgia   Headache   Cough    HPI Morgan Colon is a 40 y.o. female.   Patient presents today with 1 week history of right ear fullness.  Reports that she had a URI approximately 1 week ago that included nasal congestion, cough, headache, fatigue.  The symptoms have essentially resolved but she is continuing to have a feeling of fullness/discomfort in her right ear as well as decreased hearing.  She does have a history of similar symptoms in her left ear and has seen an ENT without improvement despite medications.  She did try taking Allegra-D but has not been taking this regularly.  She denies any associated pain or otorrhea.  She denies any ototoxic medication use including aspirin on a regular basis.  She denies history of allergies.  Blood pressure was noted to be elevated today.  Patient denies any current chest pain, shortness of breath, vision changes, headache, dizziness.  She does have blood pressure medication at home but did not take this this morning.  She does report having occasional headaches as well as a squiggly line in her vision but this has since resolved.  She has a history of retinal condition and is followed by ophthalmology in the interim this could have contributed to symptoms.  She has not seen her ophthalmologist recently.  She did take a decongestant several days ago but denies any ongoing decongestant use.  Denies any increase in sodium or caffeine consumption.   Past Medical History:  Diagnosis Date   Anemia associated with acute blood loss compounded with SVD 02/05/2018    Patient Active Problem List   Diagnosis Date Noted   Acute blood loss anemia 02/06/2018   Breech birth 02/04/2018   Cesarean delivery  02/04/2018   Postpartum care following cesarean delivery (3/23) 02/04/2018    Past Surgical  History:  Procedure Laterality Date   CESAREAN SECTION N/A 02/04/2018   Procedure: CESAREAN SECTION;  Surgeon: Olivia Mackie, MD;  Location: [facility]IRTH[facility] SUITES;  Service: Obstetrics;  Laterality: N/A;    OB History     Gravida  2   Para  2   Term  2   Preterm      AB      Living  2      SAB      IAB      Ectopic      Multiple  0   Live Births  1            Home Medications    Prior to Admission medications   Medication Sig Start Date End Date Taking? Authorizing Provider  fluticasone (FLONASE) 50 MCG/ACT nasal spray Place 1 spray into both nostrils daily. 08/18/21  Yes Yasiel Goyne, Denny Peon K, PA-C  levocetirizine (XYZAL) 5 MG tablet Take 1 tablet (5 mg total) by mouth every evening. 08/18/21  Yes Zakia Sainato K, PA-C  ibuprofen (ADVIL,MOTRIN) 600 MG tablet Take 1 tablet (600 mg total) by mouth every 6 (six) hours. Patient not taking: Reported on 07/16/2020 02/07/18   Marlinda Mike, CNM  iron polysaccharides (NIFEREX) 150 MG capsule Take 1 capsule (150 mg total) by mouth 2 (two) times daily. Patient not taking: Reported on 07/16/2020 02/07/18   Marlinda Mike, CNM  labetalol (NORMODYNE) 200 MG tablet Take 2 tablets (400 mg total)  by mouth 2 (two) times daily. Patient not taking: Reported on 07/16/2020 02/07/18   Marlinda Mike, CNM  meclizine (ANTIVERT) 25 MG tablet Take 1 tablet (25 mg total) by mouth 3 (three) times daily as needed for dizziness. 01/31/20   Mesner, Barbara Cower, MD    Family History Family History  Problem Relation Age of Onset   Hypertension Mother    Diabetes Mother     Social History Social History   Tobacco Use   Smoking status: Never   Smokeless tobacco: Never  Substance Use Topics   Alcohol use: No   Drug use: No     Allergies   Patient has no known allergies.   Review of Systems Review of Systems  Constitutional:  Negative for activity change, appetite change, fatigue and fever.  HENT:  Positive for ear pain (fullness), hearing loss and  tinnitus (chronic on left). Negative for congestion (improved), ear discharge, sinus pressure, sneezing and sore throat.   Eyes:  Negative for photophobia and visual disturbance.  Respiratory:  Negative for cough and shortness of breath.   Cardiovascular:  Negative for chest pain.  Gastrointestinal:  Negative for abdominal pain, diarrhea, nausea and vomiting.  Neurological:  Negative for dizziness, weakness, light-headedness, numbness and headaches.    Physical Exam Triage Vital Signs ED Triage Vitals  Enc Vitals Group     BP 08/18/21 1241 (!) 179/127     Pulse Rate 08/18/21 1241 93     Resp 08/18/21 1241 19     Temp 08/18/21 1241 98.5 F (36.9 C)     Temp Source 08/18/21 1241 Oral     SpO2 08/18/21 1241 98 %     Weight --      Height --      Head Circumference --      Peak Flow --      Pain Score 08/18/21 1240 0     Pain Loc --      Pain Edu? --      Excl. in GC? --    No data found.  Updated Vital Signs BP (!) 155/91 (BP Location: Left Arm)   Pulse 93   Temp 98.5 F (36.9 C) (Oral)   Resp 19   SpO2 98%   Breastfeeding No   Visual Acuity Right Eye Distance:   Left Eye Distance:   Bilateral Distance:    Right Eye Near:   Left Eye Near:    Bilateral Near:     Physical Exam Vitals reviewed.  Constitutional:      General: She is awake. She is not in acute distress.    Appearance: Normal appearance. She is well-developed. She is not ill-appearing.     Comments: Very pleasant female appears stated age in no acute distress sitting comfortably in exam room  HENT:     Head: Normocephalic and atraumatic.     Right Ear: Ear canal and external ear normal. A middle ear effusion is present. Tympanic membrane is not erythematous or bulging.     Left Ear: Ear canal and external ear normal. Tympanic membrane is retracted. Tympanic membrane is not erythematous or bulging.     Nose:     Right Sinus: No maxillary sinus tenderness or frontal sinus tenderness.     Left Sinus:  No maxillary sinus tenderness or frontal sinus tenderness.     Mouth/Throat:     Pharynx: Uvula midline. No oropharyngeal exudate or posterior oropharyngeal erythema.  Cardiovascular:     Rate and Rhythm: Normal rate  and regular rhythm.     Heart sounds: Normal heart sounds, S1 normal and S2 normal. No murmur heard. Pulmonary:     Effort: Pulmonary effort is normal.     Breath sounds: Normal breath sounds. No wheezing, rhonchi or rales.     Comments: Clear to auscultation bilaterally Lymphadenopathy:     Head:     Right side of head: No submental, submandibular or tonsillar adenopathy.     Left side of head: No submental, submandibular or tonsillar adenopathy.     Cervical: No cervical adenopathy.  Psychiatric:        Behavior: Behavior is cooperative.     UC Treatments / Results  Labs (all labs ordered are listed, but only abnormal results are displayed) Labs Reviewed - No data to display  EKG   Radiology No results found.  Procedures Procedures (including critical care time)  Medications Ordered in UC Medications - No data to display  Initial Impression / Assessment and Plan / UC Course  I have reviewed the triage vital signs and the nursing notes.  Pertinent labs & imaging results that were available during my care of the patient were reviewed by me and considered in my medical decision making (see chart for details).      No evidence of acute infection that warrant initiation of antibiotics.  Patient was started on Xyzal and Flonase to manage eustachian tube dysfunction as this is the likely cause of symptoms.  Discussed that this can be difficult to treat and encouraged her to follow-up with ENT if symptoms not improving.  She was given contact information for local provider.  Discussed that if she has any worsening symptoms including development of otalgia or otorrhea she should be evaluated immediately.  Strict return precautions given to which she expressed  understanding.  Blood pressure very elevated.  Patient denies any signs/symptoms of endorgan damage.  She was instructed to take her blood pressure medication when she gets home and monitor blood pressure closely.  If this remains elevated above 140/90 she needs to be reevaluated.  Discussed alarm symptoms that warrant emergent evaluation including headache, chest pain, shortness of breath, vision changes in the setting of high blood pressure.  Strict return precautions given to which she expressed understanding.  Recommend she follow-up with her primary care provider or our clinic for blood pressure recheck within a week.  Final Clinical Impressions(s) / UC Diagnoses   Final diagnoses:  Ear fullness, right  Decreased hearing of right ear  Elevated blood pressure reading in office with diagnosis of hypertension     Discharge Instructions      We are starting you on allergy medication to see if this will help your symptoms.  Please take Flonase and Xyzal as prescribed.  I do recommend he follow-up with ENT if your symptoms or not improving.  If you have any worsening symptoms please return for reevaluation.  Your blood pressure is very elevated today.  Please go home and take your blood pressure medication as we discussed.  Monitor your blood pressure and if this remains elevated you need to be reevaluated.  If you develop any chest pain, shortness of breath, headache, vision changes in the setting of high blood pressure you need to go to the emergency room.  Avoid decongestants, caffeine, sodium.  Follow-up with our clinic or primary care within a few days to ensure normalization of blood pressure.     ED Prescriptions     Medication Sig Dispense Auth. Provider  levocetirizine (XYZAL) 5 MG tablet Take 1 tablet (5 mg total) by mouth every evening. 30 tablet Linde Wilensky K, PA-C   fluticasone (FLONASE) 50 MCG/ACT nasal spray Place 1 spray into both nostrils daily. 16 g Taite Schoeppner K, PA-C       PDMP not reviewed this encounter.   Jeani Hawking, PA-C 08/18/21 1344

## 2021-10-11 ENCOUNTER — Other Ambulatory Visit: Payer: Self-pay

## 2021-10-11 ENCOUNTER — Emergency Department (HOSPITAL_BASED_OUTPATIENT_CLINIC_OR_DEPARTMENT_OTHER)
Admission: EM | Admit: 2021-10-11 | Discharge: 2021-10-11 | Disposition: A | Discharge status: Home / Self Care | Payer: BC Managed Care – PPO | Attending: Emergency Medicine | Admitting: Emergency Medicine

## 2021-10-11 ENCOUNTER — Encounter (HOSPITAL_BASED_OUTPATIENT_CLINIC_OR_DEPARTMENT_OTHER): Payer: Self-pay

## 2021-10-11 DIAGNOSIS — I10 Essential (primary) hypertension: Secondary | ICD-10-CM | POA: Diagnosis not present

## 2021-10-11 HISTORY — DX: Essential (primary) hypertension: I10

## 2021-10-11 MED ORDER — LABETALOL HCL 100 MG PO TABS: 200.0000 mg | ORAL_TABLET | Freq: Two times a day (BID) | ORAL | 0 refills | Status: DC

## 2021-10-11 NOTE — ED Provider Notes (Signed)
[facility] EMERGENCY DEPT Provider Note   CSN: [id number] Arrival date & time: 10/11/21  2011     History Chief Complaint  Patient presents with   Hypertension    Morgan Colon is a 40 y.o. female.  Patient is a 40 year old female with a history of hypertension that started while she was pregnant who has been on labetalol 100 mg twice daily who is presenting today due to having high blood pressure readings at home.  She denies having headache, vision changes, chest pain, shortness of breath or other symptoms.  She has just been checking her blood pressure recently and it has been running really high and she got worried.  She is taking her medication at home and trying to follow a low-salt diet.  She has a follow-up appointment with her PCP on the 10th but was worried about the high numbers today.  The history is provided by the patient.  Hypertension This is a chronic problem.      Past Medical History:  Diagnosis Date   Anemia associated with acute blood loss compounded with SVD 02/05/2018   Hypertension     Patient Active Problem List   Diagnosis Date Noted   Acute blood loss anemia 02/06/2018   Breech birth 02/04/2018   Cesarean delivery  02/04/2018   Postpartum care following cesarean delivery (3/23) 02/04/2018    Past Surgical History:  Procedure Laterality Date   CESAREAN SECTION N/A 02/04/2018   Procedure: CESAREAN SECTION;  Surgeon: Olivia Mackie, MD;  Location: [facility]IRTH[facility] SUIT[facility];  Service: Obstetrics;  Laterality: N/A;     OB History     Gravida  2   Para  2   Term  2   Preterm      AB      Living  2      SAB      IAB      Ectopic      Multiple  0   Live Births  1           Family History  Problem Relation Age of Onset   Hypertension Mother    Diabetes Mother     Social History   Tobacco Use   Smoking status: Never   Smokeless tobacco: Never  Substance Use Topics   Alcohol use: No   Drug use: No     Home Medications Prior to Admission medications   Medication Sig Start Date End Date Taking? Authorizing Provider  labetalol (NORMODYNE) 100 MG tablet Take 2 tablets (200 mg total) by mouth 2 (two) times daily. 10/11/21  Yes Desiree Fleming, Alphonzo Lemmings, MD  fluticasone (FLONASE) 50 MCG/ACT nasal spray Place 1 spray into both nostrils daily. 08/18/21   Raspet, Noberto Retort, PA-C  ibuprofen (ADVIL,MOTRIN) 600 MG tablet Take 1 tablet (600 mg total) by mouth every 6 (six) hours. Patient not taking: Reported on 07/16/2020 02/07/18   Marlinda Mike, CNM  iron polysaccharides (NIFEREX) 150 MG capsule Take 1 capsule (150 mg total) by mouth 2 (two) times daily. Patient not taking: Reported on 07/16/2020 02/07/18   Marlinda Mike, CNM  levocetirizine (XYZAL) 5 MG tablet Take 1 tablet (5 mg total) by mouth every evening. 08/18/21   Raspet, Noberto Retort, PA-C  meclizine (ANTIVERT) 25 MG tablet Take 1 tablet (25 mg total) by mouth 3 (three) times daily as needed for dizziness. 01/31/20   Mesner, Barbara Cower, MD    Allergies    Patient has no known allergies.  Review of Systems   Review  of Systems  All other systems reviewed and are negative.  Physical Exam Updated Vital Signs BP (!) 168/113   Pulse 100   Temp 98.4 F (36.9 C) (Oral)   Resp 16   Ht 5\' 4"  (1.626 m)   LMP 09/15/2021   SpO2 100%   BMI 45.66 kg/m   Physical Exam Vitals and nursing note reviewed.  Constitutional:      General: She is not in acute distress.    Appearance: Normal appearance. She is well-developed.  HENT:     Head: Normocephalic and atraumatic.  Eyes:     Pupils: Pupils are equal, round, and reactive to light.  Cardiovascular:     Rate and Rhythm: Normal rate and regular rhythm.     Heart sounds: Normal heart sounds. No murmur heard.   No friction rub.  Pulmonary:     Effort: Pulmonary effort is normal.     Breath sounds: Normal breath sounds. No wheezing or rales.  Abdominal:     General: Bowel sounds are normal. There is no distension.      Palpations: Abdomen is soft.     Tenderness: There is no abdominal tenderness. There is no guarding or rebound.  Musculoskeletal:        General: No tenderness. Normal range of motion.     Cervical back: Normal range of motion and neck supple.     Right lower leg: No edema.     Left lower leg: No edema.     Comments: No edema  Skin:    General: Skin is warm and dry.     Findings: No rash.  Neurological:     General: No focal deficit present.     Mental Status: She is alert and oriented to person, place, and time. Mental status is at baseline.     Cranial Nerves: No cranial nerve deficit.     Sensory: No sensory deficit.     Motor: No weakness.     Gait: Gait normal.  Psychiatric:        Mood and Affect: Mood normal.        Behavior: Behavior normal.    ED Results / Procedures / Treatments   Labs (all labs ordered are listed, but only abnormal results are displayed) Labs Reviewed - No data to display  EKG None  Radiology No results found.  Procedures Procedures   Medications Ordered in ED Medications - No data to display  ED Course  I have reviewed the triage vital signs and the nursing notes.  Pertinent labs & imaging results that were available during my care of the patient were reviewed by me and considered in my medical decision making (see chart for details).    MDM Rules/Calculators/A&P                           Patient is a 40 year old female presenting today with persistent high blood pressure.  She is taking 100 mg of labetalol twice daily but reports is not helping her blood pressure.  She has been on this since being pregnant with her last child.  She has no systemic symptoms concerning for endorgan damage at this time.  Patient was seen in October and had very similar blood pressure reads.  Feel that this is a more chronic issue.  She has never had renal impairment in the past and she has no distal edema or other signs concerning for endorgan damage.   Discussed  with patient increasing her labetalol to 200 mg twice daily.  She is following up with her PCP on the 10th and encouraged her to call them tomorrow for possible earlier appointment.  She may need change in her blood pressure treatment altogether.  She was given return precautions.  She was given a refill of her prescription so we will not run out until she sees her doctor.  Final Clinical Impression(s) / ED Diagnoses Final diagnoses:  Hypertension, unspecified type    Rx / DC Orders ED Discharge Orders          Ordered    labetalol (NORMODYNE) 100 MG tablet  2 times daily        10/11/21 2221             Gwyneth Sprout, MD 10/11/21 2230

## 2021-10-11 NOTE — Discharge Instructions (Addendum)
Continue to follow a low sodium diet.  Start taking 2 tablets the morning and night and keep track of your blood pressures.  If you start having vision changes, headaches, chest pain shortness of breath please return to the emergency room.

## 2021-10-11 NOTE — ED Triage Notes (Signed)
Patient states she has been having elevated blood pressure all day. The highest at home was 198/111. Patient took home med at 79. Labetolol 100mg  BID is her home medication.

## 2023-05-15 ENCOUNTER — Emergency Department (HOSPITAL_COMMUNITY)
Admission: EM | Admit: 2023-05-15 | Discharge: 2023-05-15 | Disposition: A | Discharge status: Home / Self Care | Payer: BC Managed Care – PPO | Attending: Emergency Medicine | Admitting: Emergency Medicine

## 2023-05-15 ENCOUNTER — Emergency Department (HOSPITAL_COMMUNITY): Payer: BC Managed Care – PPO

## 2023-05-15 DIAGNOSIS — R000 Tachycardia, unspecified: Secondary | ICD-10-CM | POA: Diagnosis not present

## 2023-05-15 DIAGNOSIS — R002 Palpitations: Secondary | ICD-10-CM | POA: Diagnosis present

## 2023-05-15 DIAGNOSIS — I10 Essential (primary) hypertension: Secondary | ICD-10-CM | POA: Diagnosis not present

## 2023-05-15 LAB — URINALYSIS, ROUTINE W REFLEX MICROSCOPIC
Bilirubin Urine: NEGATIVE
Glucose, UA: NEGATIVE mg/dL
Ketones, ur: NEGATIVE mg/dL
Leukocytes,Ua: NEGATIVE
Nitrite: NEGATIVE
Protein, ur: NEGATIVE mg/dL
Specific Gravity, Urine: 1.008 (ref 1.005–1.030)
pH: 6 (ref 5.0–8.0)

## 2023-05-15 LAB — HCG, QUANTITATIVE, PREGNANCY: hCG, Beta Chain, Quant, S: 1 m[IU]/mL (ref <5)

## 2023-05-15 LAB — BASIC METABOLIC PANEL
Anion gap: 13 (ref 5–15)
BUN: 10 mg/dL (ref 6–20)
CO2: 24 mmol/L (ref 22–32)
Calcium: 9.2 mg/dL (ref 8.9–10.3)
Chloride: 99 mmol/L (ref 98–111)
Creatinine, Ser: 0.72 mg/dL (ref 0.44–1.00)
GFR, Estimated: >60 mL/min (ref >60)
Glucose, Bld: 134 mg/dL — ABNORMAL HIGH (ref 70–99)
Potassium: 3.3 mmol/L — ABNORMAL LOW (ref 3.5–5.1)
Sodium: 136 mmol/L (ref 135–145)

## 2023-05-15 LAB — CBC WITH DIFFERENTIAL/PLATELET
Abs Immature Granulocytes: 0.05 10*3/uL (ref 0.00–0.07)
Basophils Absolute: 0.1 10*3/uL (ref 0.0–0.1)
Basophils Relative: 1 %
Eosinophils Absolute: 0.2 10*3/uL (ref 0.0–0.5)
Eosinophils Relative: 2 %
HCT: 33.2 % — ABNORMAL LOW (ref 36.0–46.0)
Hemoglobin: 10.5 g/dL — ABNORMAL LOW (ref 12.0–15.0)
Immature Granulocytes: 0 %
Lymphocytes Relative: 13 %
Lymphs Abs: 1.8 10*3/uL (ref 0.7–4.0)
MCH: 25.5 pg — ABNORMAL LOW (ref 26.0–34.0)
MCHC: 31.6 g/dL (ref 30.0–36.0)
MCV: 80.6 fL (ref 80.0–100.0)
Monocytes Absolute: 0.7 10*3/uL (ref 0.1–1.0)
Monocytes Relative: 5 %
Neutro Abs: 11.8 10*3/uL — ABNORMAL HIGH (ref 1.7–7.7)
Neutrophils Relative %: 79 %
Platelets: 492 10*3/uL — ABNORMAL HIGH (ref 150–400)
RBC: 4.12 MIL/uL (ref 3.87–5.11)
RDW: 14.3 % (ref 11.5–15.5)
WBC: 14.7 10*3/uL — ABNORMAL HIGH (ref 4.0–10.5)
nRBC: 0 % (ref 0.0–0.2)

## 2023-05-15 LAB — TSH: TSH: 2.81 u[IU]/mL (ref 0.350–4.500)

## 2023-05-15 MED ORDER — SODIUM CHLORIDE 0.9 % IV BOLUS
1000.0000 mL | Freq: Once | INTRAVENOUS | Status: AC
  Administered 2023-05-15: 1000 mL via INTRAVENOUS

## 2023-05-15 NOTE — ED Triage Notes (Addendum)
Patient BIB EMS from home. Riding in car without [facility], got home was hot started having palpations. Missed doses of Blood Pressure medication. B/P 200/100 Denies Chest pain/ SOB or LOC. 20gLAC, IVF. HR110. After IVF160/90
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
## 2023-05-15 NOTE — Discharge Instructions (Addendum)
Return to the ED with any new or worsening signs or symptoms Please follow-up with your PCP for reevaluation Please read the attached guide concerning palpitations Please continue to remain hydrated

## 2024-07-05 ENCOUNTER — Ambulatory Visit (HOSPITAL_BASED_OUTPATIENT_CLINIC_OR_DEPARTMENT_OTHER): Admitting: Cardiology

## 2024-09-10 ENCOUNTER — Encounter (HOSPITAL_BASED_OUTPATIENT_CLINIC_OR_DEPARTMENT_OTHER): Payer: Self-pay

## 2024-09-13 ENCOUNTER — Ambulatory Visit (INDEPENDENT_AMBULATORY_CARE_PROVIDER_SITE_OTHER): Admitting: Cardiology

## 2024-09-13 ENCOUNTER — Encounter (HOSPITAL_BASED_OUTPATIENT_CLINIC_OR_DEPARTMENT_OTHER): Payer: Self-pay | Admitting: Cardiology

## 2024-09-13 VITALS — BP 118/74 | HR 87 | Ht 64.0 in | Wt 239.0 lb

## 2024-09-13 DIAGNOSIS — Z7189 Other specified counseling: Secondary | ICD-10-CM

## 2024-09-13 DIAGNOSIS — E78011 Heterozygous familial hypercholesterolemia (hefh): Secondary | ICD-10-CM

## 2024-09-13 DIAGNOSIS — I10 Essential (primary) hypertension: Secondary | ICD-10-CM

## 2024-09-13 DIAGNOSIS — I479 Paroxysmal tachycardia, unspecified: Secondary | ICD-10-CM
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
## 2024-09-13 NOTE — Patient Instructions (Addendum)
-  recommend heart healthy/Mediterranean diet, with whole grains, fruits, vegetable, fish, lean meats, nuts, and olive oil. Limit salt. -recommend moderate walking, 3-5 times/week for 30-50 minutes each session. Aim for at least 150 minutes/week. Goal should be pace of 3 miles/hours, or walking 1.5 miles in 30 minutes  Follow up 2 years with Dr. Lonni!
